# Patient Record
Sex: Male | Born: 1975 | Race: Black or African American | Hispanic: No | Marital: Single | Smoking: Current every day smoker
Health system: Southern US, Community
[De-identification: ages and names within clinical notes are randomized; demographics above are authoritative.]

## PROBLEM LIST (undated history)

## (undated) DIAGNOSIS — M549 Dorsalgia, unspecified: Secondary | ICD-10-CM

## (undated) HISTORY — PX: NO PAST SURGERIES: SHX2092

---

## 1998-11-21 ENCOUNTER — Emergency Department (HOSPITAL_COMMUNITY): Admission: EM | Admit: 1998-11-21 | Discharge: 1998-11-21 | Payer: Self-pay | Admitting: Emergency Medicine

## 1998-11-30 ENCOUNTER — Emergency Department (HOSPITAL_COMMUNITY): Admission: EM | Admit: 1998-11-30 | Discharge: 1998-11-30 | Payer: Self-pay | Admitting: Emergency Medicine

## 1999-07-25 ENCOUNTER — Emergency Department (HOSPITAL_COMMUNITY): Admission: EM | Admit: 1999-07-25 | Discharge: 1999-07-25 | Payer: Self-pay | Admitting: Internal Medicine

## 2000-05-20 ENCOUNTER — Emergency Department (HOSPITAL_COMMUNITY): Admission: EM | Admit: 2000-05-20 | Discharge: 2000-05-20 | Payer: Self-pay | Admitting: Emergency Medicine

## 2000-06-30 ENCOUNTER — Emergency Department (HOSPITAL_COMMUNITY): Admission: EM | Admit: 2000-06-30 | Discharge: 2000-07-01 | Payer: Self-pay | Admitting: Emergency Medicine

## 2001-08-23 ENCOUNTER — Emergency Department (HOSPITAL_COMMUNITY): Admission: EM | Admit: 2001-08-23 | Discharge: 2001-08-23 | Payer: Self-pay | Admitting: Emergency Medicine

## 2001-12-07 ENCOUNTER — Encounter: Payer: Self-pay | Admitting: Emergency Medicine

## 2001-12-07 ENCOUNTER — Emergency Department (HOSPITAL_COMMUNITY): Admission: EM | Admit: 2001-12-07 | Discharge: 2001-12-07 | Payer: Self-pay | Admitting: Emergency Medicine

## 2001-12-09 ENCOUNTER — Emergency Department (HOSPITAL_COMMUNITY): Admission: EM | Admit: 2001-12-09 | Discharge: 2001-12-09 | Payer: Self-pay | Admitting: *Deleted

## 2002-02-05 ENCOUNTER — Emergency Department (HOSPITAL_COMMUNITY): Admission: EM | Admit: 2002-02-05 | Discharge: 2002-02-06 | Payer: Self-pay | Admitting: Emergency Medicine

## 2002-03-21 ENCOUNTER — Emergency Department (HOSPITAL_COMMUNITY): Admission: EM | Admit: 2002-03-21 | Discharge: 2002-03-21 | Payer: Self-pay | Admitting: Emergency Medicine

## 2002-05-15 ENCOUNTER — Emergency Department (HOSPITAL_COMMUNITY): Admission: EM | Admit: 2002-05-15 | Discharge: 2002-05-15 | Payer: Self-pay | Admitting: Emergency Medicine

## 2002-05-15 ENCOUNTER — Encounter: Payer: Self-pay | Admitting: Emergency Medicine

## 2004-02-07 ENCOUNTER — Emergency Department (HOSPITAL_COMMUNITY): Admission: EM | Admit: 2004-02-07 | Discharge: 2004-02-07 | Payer: Self-pay | Admitting: Emergency Medicine

## 2004-03-13 ENCOUNTER — Ambulatory Visit (HOSPITAL_COMMUNITY): Admission: RE | Admit: 2004-03-13 | Discharge: 2004-03-13 | Payer: Self-pay | Admitting: Internal Medicine

## 2004-04-11 ENCOUNTER — Encounter: Admission: RE | Admit: 2004-04-11 | Discharge: 2004-07-10 | Payer: Self-pay | Admitting: Orthopedic Surgery

## 2004-06-29 ENCOUNTER — Ambulatory Visit: Payer: Self-pay | Admitting: Family Medicine

## 2004-07-11 ENCOUNTER — Ambulatory Visit: Payer: Self-pay | Admitting: *Deleted

## 2004-08-17 ENCOUNTER — Emergency Department (HOSPITAL_COMMUNITY): Admission: EM | Admit: 2004-08-17 | Discharge: 2004-08-17 | Payer: Self-pay | Admitting: Emergency Medicine

## 2004-09-28 ENCOUNTER — Ambulatory Visit: Payer: Self-pay | Admitting: Family Medicine

## 2004-11-13 ENCOUNTER — Emergency Department (HOSPITAL_COMMUNITY): Admission: EM | Admit: 2004-11-13 | Discharge: 2004-11-13 | Payer: Self-pay | Admitting: *Deleted

## 2006-01-10 ENCOUNTER — Inpatient Hospital Stay (HOSPITAL_COMMUNITY): Admission: EM | Admit: 2006-01-10 | Discharge: 2006-01-14 | Payer: Self-pay | Admitting: Emergency Medicine

## 2006-01-10 ENCOUNTER — Ambulatory Visit: Payer: Self-pay | Admitting: Internal Medicine

## 2006-01-11 ENCOUNTER — Ambulatory Visit: Payer: Self-pay | Admitting: Cardiology

## 2006-01-11 ENCOUNTER — Encounter: Payer: Self-pay | Admitting: Cardiology

## 2006-02-06 ENCOUNTER — Ambulatory Visit: Payer: Self-pay | Admitting: Family Medicine

## 2007-03-07 IMAGING — CR DG CHEST 2V
2 series · 2 of 2 positions shown · non-contrast
Comparison: Earlier 01/11/06 study.

CLINICAL DATA: Fever, cough.  Follow-up aeration.
 CHEST - 2 VIEW:

[w chest pa]
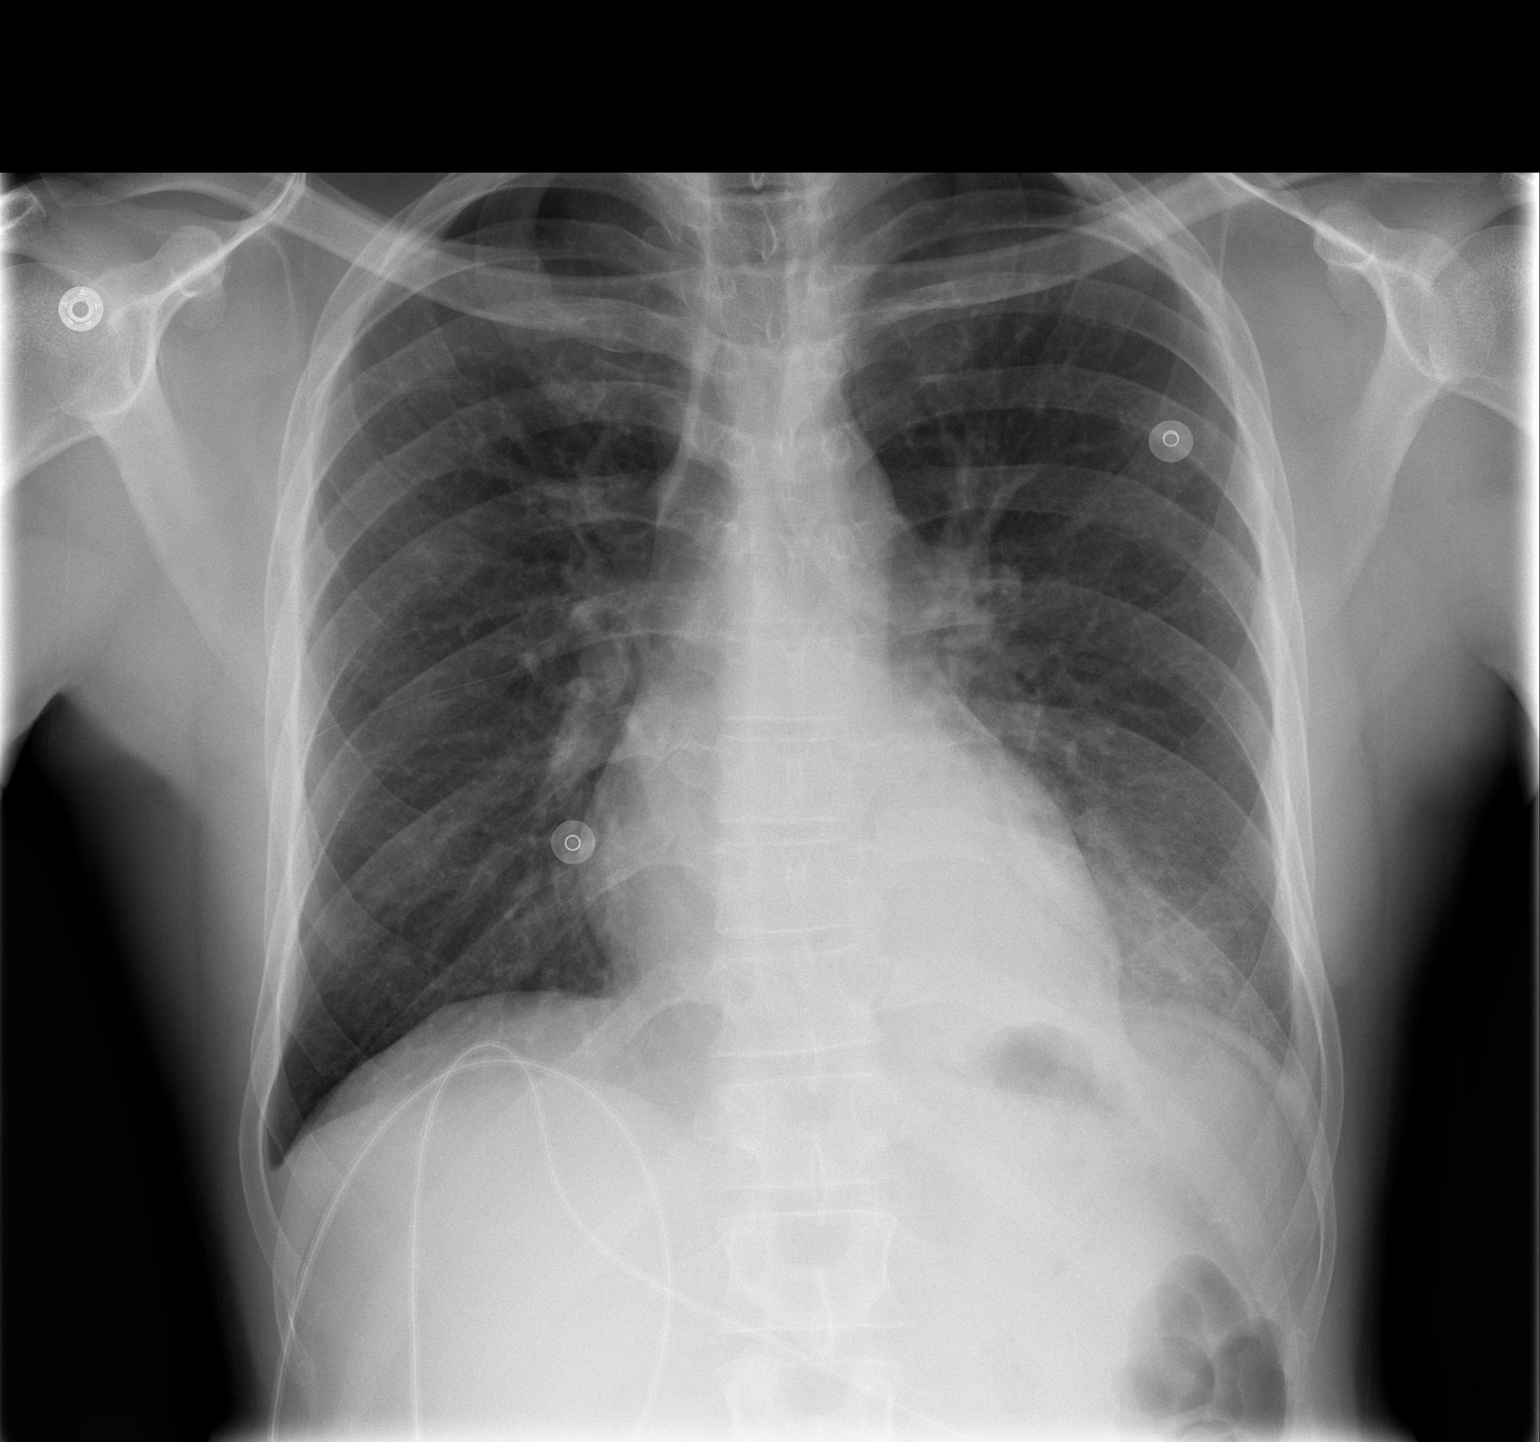

[w chest lat]
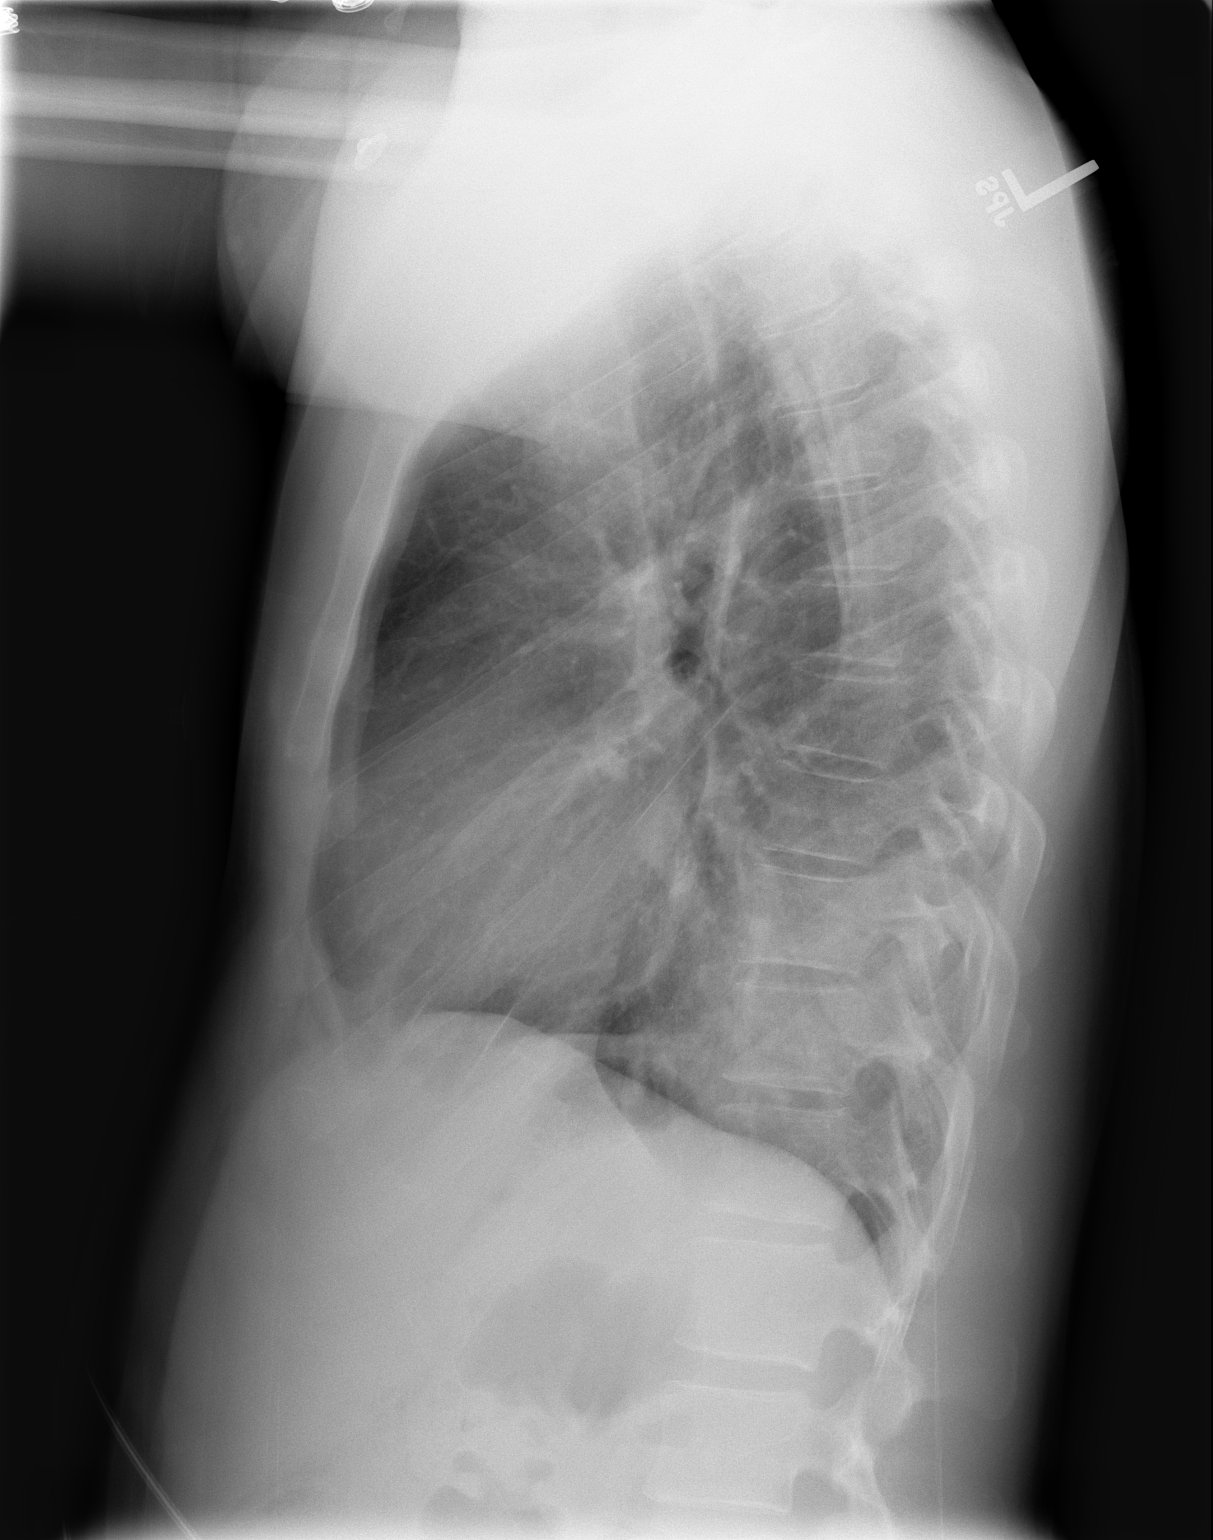

[2 of 2 positions shown; findings below may reference images not displayed]

FINDINGS: There has been interval improvement in the previously seen interstitial accentuation.  Persistent left lower lobe consolidative infiltrate noted.
IMPRESSION: Interval improvement in the previously seen mild interstitial accentuation.  Persistent left lower lobe consolidative infiltrate noted.

## 2007-06-18 ENCOUNTER — Emergency Department (HOSPITAL_COMMUNITY): Admission: EM | Admit: 2007-06-18 | Discharge: 2007-06-18 | Payer: Self-pay | Admitting: Emergency Medicine

## 2007-10-23 ENCOUNTER — Emergency Department (HOSPITAL_COMMUNITY): Admission: EM | Admit: 2007-10-23 | Discharge: 2007-10-23 | Payer: Self-pay | Admitting: Emergency Medicine

## 2008-10-08 ENCOUNTER — Emergency Department (HOSPITAL_COMMUNITY): Admission: EM | Admit: 2008-10-08 | Discharge: 2008-10-08 | Payer: Self-pay | Admitting: Emergency Medicine

## 2009-02-17 ENCOUNTER — Emergency Department (HOSPITAL_COMMUNITY): Admission: EM | Admit: 2009-02-17 | Discharge: 2009-02-17 | Payer: Self-pay | Admitting: Emergency Medicine

## 2010-11-28 ENCOUNTER — Inpatient Hospital Stay (INDEPENDENT_AMBULATORY_CARE_PROVIDER_SITE_OTHER)
Admission: RE | Admit: 2010-11-28 | Discharge: 2010-11-28 | Disposition: A | Discharge status: Home / Self Care | Payer: Self-pay | Source: Ambulatory Visit | Attending: Emergency Medicine | Admitting: Emergency Medicine

## 2010-11-28 DIAGNOSIS — M62838 Other muscle spasm: Secondary | ICD-10-CM

## 2010-11-28 DIAGNOSIS — S335XXA Sprain of ligaments of lumbar spine, initial encounter: Secondary | ICD-10-CM

## 2011-03-02 NOTE — Discharge Summary (Signed)
Erik Sims, Erik Sims NO.:  [id number]   MEDICAL RECORD NO.:  [id number]          PATIENT TYPE:  INP   LOCATION:  6713                         FACILITY:  [facility]   PHYSICIAN:  Ramiro Harvest, MD    DATE OF BIRTH:  1976/02/08   DATE OF ADMISSION:  01/10/2006  DATE OF DISCHARGE:  01/14/2006                                 DISCHARGE SUMMARY   DISCHARGE DIAGNOSES:  1.  Pneumonia.  2.  Elevated cardiac enzymes.  3.  Polysubstance abuse.  4.  Tobacco use.   DISCHARGE MEDICATIONS:  Avelox 400 mg p.o. daily x6 days.   DISPOSITION:  On the day of discharge Erik Sims was in good condition. He  was afebrile, his vital signs were stable, and he had a normal white blood  cell count. Chest x-ray on the morning of discharge revealed a resolving  left lower lobe pneumonia. The patient had received 4 of 10 days of  antibiotics at this point. Clinically he looked well. He was eager to leave  the hospital, and he had instructions to return to the [facility]  today. On the day of discharge he was given a prescription for Avelox which  he was going to fill at [facility] and at that time he would stop  by the appointment window and set up a follow up appointment within 1 week  of his discharge. The patient was in full understanding of this plan, and A.  IRandon Goldsmith spoke to [facility] reception who is also understanding of  this plan and would see to it that it was facilitated this way. There are no  crucial lab values to obtain in this follow up visit. However attention  should be paid to the patient's clinical examination to ensure resolving  pneumonia.   PROCEDURE:  On January 11, 2006 the patient had a transthoracic echocardiogram  which he tolerated well. The echocardiogram revealed left ventricular  ejection fraction estimated to be 50%. It also revealed hypokinesis of the  mid and apical segments of the inferior wall and inferior septum.   CONSULTATIONS:  There were no consultations obtained for the patient.   BRIEF HISTORY AND PHYSICAL:  Erik Sims is a 35 year old African-American  male with a history of lower back pain who presents with 1 day of coughing  and upper left-sided back pain. The patient had an upper respiratory-like  illness around 3 days prior to admission. This evolved into a cough that is  now productive of clear yellow and occasionally blood tinged sputum. The  patient had reported subjective fever with shaking chills. The patient is a  smoker with a greater than 10 pack year history. The patient also reports  positive history of smoking crack, but per his report he quit in late 2006.  The patient also consumes on average one 6-pack of beer per day. He denies  any recent trauma, black outs or loss of consciousness. Please see the  patient's chart for any further details regarding the admission history.   ADMISSION PHYSICAL EXAM:  The patient's temperature was 102.7 degrees  Fahrenheit, blood  pressure was 122/79, his pulse was 124, respiratory rate  of 18, O2 saturations were 87% on room air. In general he is an ill-  appearing man lying on his right side in apparent discomfort. His eyes were  anicteric, pupils are equal, round and reactive to light and accommodation.  Extraocular muscles were intact. Tympanic membranes were pearly bilaterally.  His posterior oropharynx was pink and moist. He had poor dentition noted.  His neck was supple. On examination of his lungs, coarse rhonchi were heard  in the left lower lobe, he also had coarse breath sounds in the right lower  lobe. Cardiovascular exam revealed tachycardia with no murmurs, rubs, or  gallops. Abdomen was slightly distended but was soft, nontender to palpation  with no rebound and no guarding. Extremities revealed no clubbing, cyanosis  or edema. His skin was intact with no rashes or breakdown. He had no  cervical or submandibular  lymphadenopathy. He was alert and oriented x3  however the patient made very poor eye contact. He was somewhat inattentive.  He denies any depression, anxiety or suicidal or homicidal ideation.   ADMISSION LABS:  Revealed a sodium of 134, potassium of 4.0, chloride of 98,  bicarb of 24, BUN of 7, creatinine of 1.1, glucose of 114. A white blood  cell count of 19.6 thousand, platelets of 276,000, hemoglobin 13.1.  Bilirubin 0.8, alkaline phosphatase 107, AST 23, ALT 19, protein 6.9,  albumin 3.9, calcium 9.6. He also had a urinalysis performed that was only  positive for ketones. He had a negative alcohol screen. He had a positive  strep pneumococcus urinary antigen. He had a urine drug screen which was  positive for cocaine as well as THC.   HOSPITAL COURSE:  PROBLEM #1. Left lower lobe pneumonia. The patient was  admitted to [facility] and started on empiric antibiotics. He  received IV Avelox. We continued to follow the patient clinically. His white  blood cell count again on admission was 19.6 thousand, it rose on the second  day of admission to  24.4 thousand and began to slowly trend down to 21.2 thousand and then 11.6  thousand. The patient appeared quite ill on the day of admission. Throughout  his hospitalization though clinically he improved quite well.  On the day of  discharge he was fully ambulating and very eager to leave the hospital. He  reported feeling quite well and again, was eager to leave. We also followed  the patient's pneumonia which surprisingly resolved quite well  radiographically. Chest x-ray on the day of discharge showed a resolving  left lower lobe pneumonia.   PROBLEM #2. Elevated cardiac enzymes. The admitting team was concerned that  due to the patient's positive drug screen and history of smoking crack, that  his heart may have suffered some type of cocaine induced strain. Cardiac enzymes as well as an EKG were obtained on the day of  admission. EKG was  read as normal. However the patient's first set of cardiac enzymes revealed  an elevated CK-MB of 5.2 and an elevated troponin I of 0.16. A second set  obtained 8 hours later revealed a normal CK-MB of 3.5 and a troponin I of  0.18. Another set obtained 8 hours later revealed a normal CK-MB of 0.9 and  a normal troponin I of 0.03. The treating team did not think that this was  an acute ischemic event. However to ensure that he had sufficient cardiac  function and no worrisome findings,  a 2D echocardiogram was performed. Again  this was performed on January 11, 2006. Please see the above transcription.  Basically it revealed some hypokinesis of the inferior wall as well as  septum, otherwise with a normal ejection fraction. The patient again never  complained of any chest pain, shortness of breath or diaphoresis. The  treating team was quite comfortable with his cardiac status at the time of  discharge.   PROBLEM #3. Polysubstance abuse. Again the patient had a positive history of  smoking crack cocaine as well as a positive urine drug screen for cocaine as  well as THC. The patient was offered clinical work Chief of Staff and a  brief psychosocial assessment. He was offered substance abuse treatment  referral and the patient denied the need for any help. He has never received  any substance abuse treatment in the past. He did express appreciation to  the clinical social worker however for attempt at referrals.   PROBLEM #4. Tobacco abuse. Again, tobacco cessation tools were offered to  Mr. Fuerte. However he denied wanting or needed such tools.   DISCHARGE LABS:  On the day of discharge patient had a sodium of 138,  potassium of 4.3, chloride of 105, CO2 of 27, glucose of 113, BUN of 5,  creatinine of 1.1, calcium of 9.2. His white blood cell count was 12.6  thousand, hemoglobin was 13.0, hematocrit 40.1, MCV of 74.7, platelets were  379,000.   His vital signs  on the day of discharge revealed a temperature of 97.6,  pulse of 91, respirations of 18, systolic blood pressure of 92, diastolic  62. He was saturating 97% on room air.      Ramiro Harvest, MD    ______________________________  Ramiro Harvest, MD    DT/MEDQ  D:  01/14/2006  T:  01/16/2006  Job:  [id number]   cc:   Ileana Roup, M.D.  Fax: [phone]   [facility]  [address]

## 2014-07-11 ENCOUNTER — Emergency Department (HOSPITAL_COMMUNITY)
Admission: EM | Admit: 2014-07-11 | Discharge: 2014-07-11 | Disposition: A | Discharge status: Home / Self Care | Payer: Self-pay | Attending: Emergency Medicine | Admitting: Emergency Medicine

## 2014-07-11 DIAGNOSIS — S39012A Strain of muscle, fascia and tendon of lower back, initial encounter: Secondary | ICD-10-CM

## 2014-07-11 DIAGNOSIS — Y990 Civilian activity done for income or pay: Secondary | ICD-10-CM | POA: Insufficient documentation

## 2014-07-11 DIAGNOSIS — M545 Low back pain, unspecified: Secondary | ICD-10-CM | POA: Insufficient documentation

## 2014-07-11 DIAGNOSIS — Y9389 Activity, other specified: Secondary | ICD-10-CM | POA: Insufficient documentation

## 2014-07-11 DIAGNOSIS — S335XXA Sprain of ligaments of lumbar spine, initial encounter: Secondary | ICD-10-CM | POA: Insufficient documentation

## 2014-07-11 DIAGNOSIS — Y9289 Other specified places as the place of occurrence of the external cause: Secondary | ICD-10-CM | POA: Insufficient documentation

## 2014-07-11 DIAGNOSIS — X503XXA Overexertion from repetitive movements, initial encounter: Secondary | ICD-10-CM | POA: Insufficient documentation

## 2014-07-11 MED ORDER — DIAZEPAM 5 MG PO TABS: 5.0000 mg | ORAL_TABLET | Freq: Four times a day (QID) | ORAL | Status: DC | PRN

## 2014-07-11 NOTE — ED Notes (Signed)
Neuro assessment at 12:35. Charted on wrong pt. By Gayleen Orem, RN

## 2014-07-11 NOTE — ED Provider Notes (Signed)
CSN: [id number]     Arrival date & time 07/11/14  1029 History   First MD Initiated Contact with Patient 07/11/14 1056     Chief Complaint  Patient presents with  . Back Pain    Herniated disc at L5-S1 in 2005 it has flared up again about 1 week ago     (Consider location/radiation/quality/duration/timing/severity/associated sxs/prior Treatment) Patient is a 38 y.o. male presenting with back pain.  Back Pain Location:  Lumbar spine Quality:  Aching Radiates to:  Does not radiate Pain severity:  Severe Pain is:  Same all the time Onset quality:  Gradual Duration:  1 week Timing:  Constant Progression:  Unchanged Chronicity:  Recurrent Context comment:  Works Curator Relieved by:  Nothing Worsened by:  Touching and twisting Associated symptoms: no bladder incontinence, no bowel incontinence, no dysuria, no fever, no leg pain, no numbness, no paresthesias, no perianal numbness and no tingling     No past medical history on file. No past surgical history on file. No family history on file. History  Substance Use Topics  . Smoking status: Not on file  . Smokeless tobacco: Not on file  . Alcohol Use: Not on file    Review of Systems  Constitutional: Negative for fever.  Gastrointestinal: Negative for bowel incontinence.  Genitourinary: Negative for bladder incontinence and dysuria.  Musculoskeletal: Positive for back pain.  Neurological: Negative for tingling, numbness and paresthesias.  All other systems reviewed and are negative.     Allergies  Prednisone  Home Medications   Prior to Admission medications   Medication Sig Start Date End Date Taking? Authorizing Provider  diazepam (VALIUM) 5 MG tablet Take 1 tablet (5 mg total) by mouth every 6 (six) hours as needed for muscle spasms. 07/11/14   Mirian Mo, MD   BP 123/82  Pulse 78  Temp(Src) 98 F (36.7 C) (Oral)  Resp 16  SpO2 100% Physical Exam  Vitals reviewed. Constitutional: He is  oriented to person, place, and time. He appears well-developed and well-nourished.  HENT:  Head: Normocephalic and atraumatic.  Eyes: Conjunctivae and EOM are normal.  Neck: Normal range of motion. Neck supple.  Cardiovascular: Normal rate, regular rhythm and normal heart sounds.   Pulmonary/Chest: Effort normal and breath sounds normal. No respiratory distress.  Abdominal: He exhibits no distension. There is no tenderness. There is no rebound and no guarding.  Musculoskeletal: Normal range of motion.       Cervical back: Normal.       Thoracic back: Normal.       Lumbar back: He exhibits pain. He exhibits no tenderness and no bony tenderness.  Neurological: He is alert and oriented to person, place, and time.  Neuro exam Bil LE unremarkable  Skin: Skin is warm and dry.    ED Course  Procedures (including critical care time) Labs Review Labs Reviewed - No data to display  Imaging Review No results found.   EKG Interpretation None      MDM   Final diagnoses:  Lumbar strain, initial encounter    38 y.o. male with pertinent PMH of DDD presents with recurrent lumbar pain x 1 week.  Patient was in his normal state of health and works in Phot[employer], when he began to have spasms in his back. He has had caudal work 2 days this week. He denies fever, nausea, vomiting, numbness, tingling, any historical features of cauda equina. On arrival today vitals signs and physical exam as above unremarkable  with the exception of pain in his lumbar spine. As pain is worsened by movement and is consistent with prior degenerative disc flare and has no signs of cord compression given stable to discharge with standard therapy for lumbar strain. He was given referral to neurosurgery for a spine. He voiced understanding of precautions, agreed with plan and will followup.    1. Lumbar strain, initial encounter         Mirian Mo, MD 07/11/14 1152

## 2014-07-11 NOTE — ED Notes (Signed)
Pt reports that pain in back around L5-S1,that he was originally dx in 2005, no surgery at that time just dx of herniated disc.    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

## 2014-07-11 NOTE — Discharge Instructions (Signed)
Back Exercises °Back exercises help treat and prevent back injuries. The goal of back exercises is to increase the strength of your abdominal and back muscles and the flexibility of your back. These exercises should be started when you no longer have back pain. Back exercises include: °· Pelvic Tilt. Lie on your back with your knees bent. Tilt your pelvis until the lower part of your back is against the floor. Hold this position 5 to 10 sec and repeat 5 to 10 times. °· Knee to Chest. Pull first 1 knee up against your chest and hold for 20 to 30 seconds, repeat this with the other knee, and then both knees. This may be done with the other leg straight or bent, whichever feels better. °· Sit-Ups or Curl-Ups. Bend your knees 90 degrees. Start with tilting your pelvis, and do a partial, slow sit-up, lifting your trunk only 30 to 45 degrees off the floor. Take at least 2 to 3 seconds for each sit-up. Do not do sit-ups with your knees out straight. If partial sit-ups are difficult, simply do the above but with only tightening your abdominal muscles and holding it as directed. °· Hip-Lift. Lie on your back with your knees flexed 90 degrees. Push down with your feet and shoulders as you raise your hips a couple inches off the floor; hold for 10 seconds, repeat 5 to 10 times. °· Back arches. Lie on your stomach, propping yourself up on bent elbows. Slowly press on your hands, causing an arch in your low back. Repeat 3 to 5 times. Any initial stiffness and discomfort should lessen with repetition over time. °· Shoulder-Lifts. Lie face down with arms beside your body. Keep hips and torso pressed to floor as you slowly lift your head and shoulders off the floor. °Do not overdo your exercises, especially in the beginning. Exercises may cause you some mild back discomfort which lasts for a few minutes; however, if the pain is more severe, or lasts for more than 15 minutes, do not continue exercises until you see your caregiver.  Improvement with exercise therapy for back problems is slow.  °See your caregivers for assistance with developing a proper back exercise program. °Document Released: 11/08/2004 Document Revised: 12/24/2011 Document Reviewed: 08/02/2011 °ExitCare® Patient Information ©2015 [employer]. This information is not intended to replace advice given to you by your health care provider. Make sure you discuss any questions you have with your health care provider. °Back Pain, Adult °Low back pain is very common. About 1 in 5 people have back pain. The cause of low back pain is rarely dangerous. The pain often gets better over time. About half of people with a sudden onset of back pain feel better in just 2 weeks. About 8 in 10 people feel better by 6 weeks.  °CAUSES °Some common causes of back pain include: °· Strain of the muscles or ligaments supporting the spine. °· Wear and tear (degeneration) of the spinal discs. °· Arthritis. °· Direct injury to the back. °DIAGNOSIS °Most of the time, the direct cause of low back pain is not known. However, back pain can be treated effectively even when the exact cause of the pain is unknown. Answering your caregiver's questions about your overall health and symptoms is one of the most accurate ways to make sure the cause of your pain is not dangerous. If your caregiver needs more information, he or she may order lab work or imaging tests (X-rays or MRIs). However, even if imaging tests show changes in your back,   this usually does not require surgery. °HOME CARE INSTRUCTIONS °For many people, back pain returns. Since low back pain is rarely dangerous, it is often a condition that people can learn to manage on their own.  °· Remain active. It is stressful on the back to sit or stand in one place. Do not sit, drive, or stand in one place for more than 30 minutes at a time. Take short walks on level surfaces as soon as pain allows. Try to increase the length of time you walk each  day. °· Do not stay in bed. Resting more than 1 or 2 days can delay your recovery. °· Do not avoid exercise or work. Your body is made to move. It is not dangerous to be active, even though your back may hurt. Your back will likely heal faster if you return to being active before your pain is gone. °· Pay attention to your body when you  bend and lift. Many people have less discomfort when lifting if they bend their knees, keep the load close to their bodies, and avoid twisting. Often, the most comfortable positions are those that put less stress on your recovering back. °· Find a comfortable position to sleep. Use a firm mattress and lie on your side with your knees slightly bent. If you lie on your back, put a pillow under your knees. °· Only take over-the-counter or prescription medicines as directed by your caregiver. Over-the-counter medicines to reduce pain and inflammation are often the most helpful. Your caregiver may prescribe muscle relaxant drugs. These medicines help dull your pain so you can more quickly return to your normal activities and healthy exercise. °· Put ice on the injured area. °¨ Put ice in a plastic bag. °¨ Place a towel between your skin and the bag. °¨ Leave the ice on for 15-20 minutes, 03-04 times a day for the first 2 to 3 days. After that, ice and heat may be alternated to reduce pain and spasms. °· Ask your caregiver about trying back exercises and gentle massage. This may be of some benefit. °· Avoid feeling anxious or stressed. Stress increases muscle tension and can worsen back pain. It is important to recognize when you are anxious or stressed and learn ways to manage it. Exercise is a great option. °SEEK MEDICAL CARE IF: °· You have pain that is not relieved with rest or medicine. °· You have pain that does not improve in 1 week. °· You have new symptoms. °· You are generally not feeling well. °SEEK IMMEDIATE MEDICAL CARE IF:  °· You have pain that radiates from your back into  your legs. °· You develop new bowel or bladder control problems. °· You have unusual weakness or numbness in your arms or legs. °· You develop nausea or vomiting. °· You develop abdominal pain. °· You feel faint. °Document Released: 10/01/2005 Document Revised: 04/01/2012 Document Reviewed: 02/02/2014 °ExitCare® Patient Information ©2015 [employer]. This information is not intended to replace advice given to you by your health care provider. Make sure you discuss any questions you have with your health care provider. ° ° °Emergency Department Resource Guide °1) Find a Doctor and Pay Out of Pocket °Although you won't have to find out who is covered by your insurance plan, it is a good idea to ask around and get recommendations. You will then need to call the office and see if the doctor you have chosen will accept you as a new patient and what types of options they offer for patients who are self-pay. Some doctors offer discounts or will set   up payment plans for their patients who do not have insurance, but you will need to ask so you aren't surprised when you get to your appointment. ° °2) Contact Your Local Health Department °Not all health departments have doctors that can see patients for sick visits, but many do, so it is worth a call to see if yours does. If you don't know where your local health department is, you can check in your phone book. The CDC also has a tool to help you locate your state's health department, and many state websites also have listings of all of their local health departments. ° °3) Find a Walk-in Clinic °If your illness is not likely to be very severe or complicated, you may want to try a walk in clinic. These are popping up all over the country in pharmacies, drugstores, and shopping centers. They're usually staffed by nurse practitioners or physician assistants that have been trained to treat common illnesses and complaints. They're usually fairly quick and inexpensive. However,  if you have serious medical issues or chronic medical problems, these are probably not your best option. ° °No Primary Care Doctor: °- Call [facility] at  832-8000 - they can help you locate a primary care doctor that  accepts your insurance, provides certain services, etc. °- Physician Referral Service- 1-800-533-3463 ° °Chronic Pain Problems: °Organization         Address  Phone   Notes  °[facility]  (336) 297-2271 Patients need to be referred by their primary care doctor.  ° °Medication Assistance: °Organization         Address  Phone   Notes  °[place] [facility] [address] °[place], [place] [address] (336) 641-8030 --Must be a resident of [place] °-- Must have NO insurance coverage whatsoever (no Medicaid/ Medicare, etc.) °-- The pt. MUST have a primary care doctor that directs their care regularly and follows them in the community °  °[facility]  (866) 331-1348   °[employer]  (888) 892-1162   ° °Agencies that provide inexpensive medical care: °Organization         Address  Phone   Notes  °[facility]  (336) 832-8035   °[facility]    (336) 832-7272   °[facility] [address] °[place], [place] [address] (336) 832-4777   °[facility] [address], °[place] (336) 271-4999   °[facility]    (336) 373-0678   °[facility]    (336) 272-1050   °[facility] ° [address], [place] Phone:  (336) 832-4444, Fax:  (336) 832-4440 Hours of Operation:  9 am - 6 pm, M-F.  Also accepts Medicaid/Medicare and self-pay.  °[facility] ° [address], [place] Phone: (336) 832-3150, Fax: (336) 832-3151. Hours of Operation:  8:30 am - 5:30 pm, M-F.  Also accepts Medicaid and self-pay.  °[facility] [place] [address], [place] Phone: (336) 878-6027   °[facility] [address], [place], [place] (336)723-1848, Ext. 123 Mondays & Thursdays: 7-9 AM.  First 15 patients are seen on a first come, first serve basis. °  ° °Medicaid-accepting [place] Providers: ° °Organization         Address  Phone   Notes  °[facility] [address],  (336) 641-2100 Also   accepts self-pay patients.  °[facility] [address], [place] ° (336) 856-9996   °[facility] [address], [place] (336) 288-8857   °[facility] [address] [place] [address], [place] (336) 299-7000   °Veita Bland [address], [place]  ° (336) 373-1557 Only accepts [place] Access Medicaid patients after they have their name applied to their card.  ° °Self-Pay (no insurance) in [place]: ° °Organization         Address  Phone   Notes  °Sickle Cell Patients, [facility] [address], [place] (336) 832-1970   °[facility] [address], [place] (336) 832-4400   °[facility] ° [address], [place] (336) 992-4800   °[facility]/Dr. Osei-Bonsu ° [address] [place] [address], [place] or [address], [place] (336) 841-8500 Phone number for both [place] and [place] locations is the same.  °[facility] [address], [place] (336) 299-0000   °[facility] [address] [place] [address], [place] or [address] (336) 852-7530 °(336) 878-2260   °[facility] [address], [place] (336) 350-1642, phone; (336) 294-5005, fax Sees patients 1st and 3rd Saturday of every month.  Must not qualify for public or private insurance (i.e. Medicaid, Medicare, [place] Health Choice, Veterans' Benefits) • Household income should be no more than 200% of the poverty level •The clinic cannot treat you if you are pregnant or think you are  pregnant • Sexually transmitted diseases are not treated at the clinic.  ° ° °Dental Care: °Organization         Address  Phone  Notes  °[facility] [address], [place] (336) 641-6152 Accepts children up to age 21 who are enrolled in Medicaid or [place] Health Choice; pregnant women with a Medicaid card; and children who have applied for Medicaid or [place] Health Choice, but were declined, whose parents can pay a reduced fee at time of service.  °[facility] [place]  [address], [place] (336) 641-7733 Accepts children up to age 21 who are enrolled in Medicaid or [place] Health Choice; pregnant women with a Medicaid card; and children who have applied for Medicaid or [place] Health Choice, but were declined, whose parents can pay a reduced fee at time of service.  °[facility] ° [address],  (336) 641-4533 Patients are seen by appointment only. Walk-ins are not accepted. [facility] will see patients 18 years of age and older. °Monday - Tuesday (8am-5pm) °Most Wednesdays (8:30-5pm) °$30 per visit, cash only  °[facility] ° [address], [place] (336) 641-4533 Patients are seen by appointment only. Walk-ins are not accepted. [facility] will see patients 18 years of age and older. °One Wednesday Evening (Monthly: Volunteer Based).  $30 per visit, cash only  °[facility]  (919) 537-3737 for adults; Children under age 4, call Graduate Pediatric Dentistry at (919) [phone]. Children aged 4-14, please call (919) [phone] to request a pediatric application. ° Dental services are provided in all areas of dental care including fillings, crowns and bridges, complete and partial dentures, implants, gum treatment, root canals, and extractions. Preventive care is also provided. Treatment is provided   to both adults and  children. °Patients are selected via a lottery and there is often a waiting list. °  °[facility] [address], °[place] ° (336) 763-8833 [email or web address] °  °[facility] [address], [place], [place] (336)723-1848, Ext. 123 Second and Fourth Thursday of each month, opens at 6:30 AM; Clinic ends at 9 AM.  Patients are seen on a first-come first-served basis, and a limited number are seen during each clinic.  ° °[facility] ° [address], [place], [place] (336) 723-7904   Eligibility Requirements °You must have lived in [place], [place], or [place] counties for at least the last three months. °  You cannot be eligible for state or federal sponsored healthcare insurance, including Veterans Administration, Medicaid, or Medicare. °  You generally cannot be eligible for healthcare insurance through your employer.  °  How to apply: °Eligibility screenings are held every Tuesday and Wednesday afternoon from 1:00 pm until 4:00 pm. You do not need an appointment for the interview!  °[facility] [address], [place], [place] 336-631-2330   °[facility]  336-342-8273   °[facility]  336-703-3100   °[facility]  336-570-6415   ° °Behavioral Health Resources in the Community: °Intensive Outpatient Programs °Organization         Address  Phone  Notes  °[facility] [address], [place], [place] 336-878-6098   °[facility] [address], [place], [place] 336-832-9800   °[facility] [address], [place], [place] ° 336-882-2125   °[facility] [address],  °[place], [place] 1-800-853-5163 or 336-641-4981   °Substance Abuse Resources °Organization         Address  Phone  Notes  °[facility]  336-882-2125   °[facility]  336-784-9470   °[facility]  336-285-9073    °[facility]  336-845-3988   °Residential & Outpatient Substance Abuse Program  1-800-659-3381   °Psychological Services °Organization         Address  Phone  Notes  °[facility]  336- 832-9600   °[facility]  336- 378-7881   °[facility] [address], [place] 1-800-853-5163 or 336-641-4981   ° °Mobile Crisis Teams °Organization         Address  Phone  Notes  °[facility], Mobile Crisis Care Unit  1-877-626-1772   °Assertive °[facility] ° [address] [place], [place] 336-834-9664   °Sharon DeEsch [address] °[place] 336-554-5454   ° °Self-Help/Support Groups °Organization         Address  Phone             Notes  °[facility] [place] - variety of support groups  336- 373-1402 Call for more information  °Narcotics Anonymous (NA), [facility] [address], °[place]  2 meetings at this location  ° °Residential Treatment Programs °Organization         Address  Phone  Notes  °[facility] [address],    °[place]  1-866-801-8205   °[facility] ° [address], [place], [place] 704-293-8524   °[facility] [address], [place] 336-845-3988 Admissions: 8am-3pm M-F  °[facility] [address]    °[place],  336-841-1104   °  [facility] [address], [place], [place] 336-379-7146   °[facility] [address]  °[place], [place] 336-285-9073   °[facility] Intensive Outpatient [address], [place], [place] 336-852-3033   °[facility] [address]  °[place], [place] 1-877-615-2722 or 336-784-9470   °[facility] [address] [place], [place] 336-227-7417 Accepts Medicaid  °[facility] [address]  °[place] 1-800-659-3381 Substance Abuse/Addiction Treatment  ° °[place]  Behavioral Health Resources °Organization         Address  Phone  Notes  °[facility]  (888) 581-9988   °Julie Brannon, PhD [address] [place], [place]   (336) 349-5553 or (336) 951-0000   °[facility]   [address] °[place], [place] (336) 349-4454   °[facility] [address], [place], [place] (336) 342-8316 Insurance/Medicaid/sponsorship through [facility]  °[facility] [address]                                    [place], [place] (336) 342-8316 Therapy/tele-psych/case  °[facility] [address]  ° [place], [place] (336) 349-2233    °Dr. Arfeen  (336) 349-4544   °[facility] [place]  [employer] [place] [facility] 1) [address], [place] °2) [address], [place] °3)  [address], [place] (336) 349-3220 °(336) 342-7768 ° °(336) 342-8140   °[place] [facility] (336) 342-1394 or (336) 342-3537 (After Hours)    ° ° ° °

## 2014-07-11 NOTE — ED Notes (Signed)
Bed: WA01 Expected date:  Expected time:  Means of arrival:  Comments: 

## 2014-11-10 ENCOUNTER — Emergency Department (HOSPITAL_COMMUNITY)
Admission: EM | Admit: 2014-11-10 | Discharge: 2014-11-10 | Disposition: A | Discharge status: Home / Self Care | Payer: Self-pay | Attending: Emergency Medicine | Admitting: Emergency Medicine

## 2014-11-10 ENCOUNTER — Encounter (HOSPITAL_COMMUNITY): Payer: Self-pay | Admitting: *Deleted

## 2014-11-10 DIAGNOSIS — Z79899 Other long term (current) drug therapy: Secondary | ICD-10-CM | POA: Insufficient documentation

## 2014-11-10 DIAGNOSIS — M545 Low back pain, unspecified: Secondary | ICD-10-CM

## 2014-11-10 DIAGNOSIS — Z720 Tobacco use: Secondary | ICD-10-CM | POA: Insufficient documentation

## 2014-11-10 DIAGNOSIS — Z202 Contact with and (suspected) exposure to infections with a predominantly sexual mode of transmission: Secondary | ICD-10-CM

## 2014-11-10 HISTORY — DX: Dorsalgia, unspecified: M54.9

## 2014-11-10 MED ORDER — AZITHROMYCIN 250 MG PO TABS: 1000.0000 mg | ORAL_TABLET | Freq: Once | ORAL | Status: DC

## 2014-11-10 MED ORDER — AZITHROMYCIN 250 MG PO TABS
1000.0000 mg | ORAL_TABLET | Freq: Once | ORAL | Status: AC
  Administered 2014-11-10: 1000 mg via ORAL
  Filled 2014-11-10: qty 4

## 2014-11-10 MED ORDER — DIAZEPAM 5 MG PO TABS: 5.0000 mg | ORAL_TABLET | Freq: Four times a day (QID) | ORAL | Status: DC | PRN

## 2014-11-10 MED ORDER — KETOROLAC TROMETHAMINE 30 MG/ML IJ SOLN
30.0000 mg | Freq: Once | INTRAMUSCULAR | Status: AC
  Administered 2014-11-10: 30 mg via INTRAMUSCULAR
  Filled 2014-11-10: qty 1

## 2014-11-10 NOTE — ED Notes (Signed)
Pt reports chronic low back pain x 1 week.  Has herniated disc in 2006.  Denies injury at this time.

## 2014-11-10 NOTE — ED Notes (Signed)
Patient is alert and oriented x3.  He was given DC instructions and follow up visit instructions.  Patient gave verbal understanding.  He was DC ambulatory under his own power to home.  V/S stable.  He was not showing any signs of distress on DC 

## 2014-11-10 NOTE — ED Provider Notes (Signed)
CSN: [id number]     Arrival date & time 11/10/14  0054 History   First MD Initiated Contact with Patient 11/10/14 0134     Chief Complaint  Patient presents with  . Back Pain     (Consider location/radiation/quality/duration/timing/severity/associated sxs/prior Treatment) HPI Comments: This a 39 year old African-American male with a history of herniated disc at L5 in 2006 that was not corrected with surgery.  He's had intermittent episodes of pain since reports that for the past week he's had continuous low back pain that does not radiate.  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  Has been taking over-the-counter ibuprofen with little relief.  Denies any bowel or bladder incontinence, loss of sensation, inability to move his leg forward.  Denies numbness and tingling  Patient is a 39 y.o. male presenting with back pain. The history is provided by the patient.  Back Pain Location:  Lumbar spine Quality:  Aching Radiates to:  Does not radiate Pain severity:  Moderate Pain is:  Same all the time Onset quality:  Unable to specify Timing:  Constant Progression:  Unchanged Chronicity:  New Context: lifting heavy objects, physical stress and twisting   Relieved by:  Nothing Worsened by:  Movement Ineffective treatments:  NSAIDs Associated symptoms: no abdominal pain, no abdominal swelling, no bladder incontinence, no bowel incontinence, no chest pain, no dysuria, no fever, no leg pain, no numbness, no paresthesias and no weakness     Past Medical History  Diagnosis Date  . Back pain    History reviewed. No pertinent past surgical history. No family history on file. History  Substance Use Topics  . Smoking status: Current Every Day Smoker -- 1.00 packs/day    Types: Cigarettes  . Smokeless tobacco: Not on file  . Alcohol Use: Yes    Review of Systems  Constitutional:  Negative for fever.  Cardiovascular: Negative for chest pain.  Gastrointestinal: Negative for abdominal pain and bowel incontinence.  Genitourinary: Negative for bladder incontinence, dysuria, frequency and flank pain.  Musculoskeletal: Positive for back pain. Negative for joint swelling.  Neurological: Negative for weakness, numbness and paresthesias.  All other systems reviewed and are negative.     Allergies  Prednisone  Home Medications   Prior to Admission medications   Medication Sig Start Date End Date Taking? Authorizing Provider  Menthol, Topical Analgesic, (ICY HOT EX) Apply 1 application topically as needed (for back pain.).   Yes Historical Provider, MD  diazepam (VALIUM) 5 MG tablet Take 1 tablet (5 mg total) by mouth every 6 (six) hours as needed for muscle spasms. 11/10/14   Arman FilterGail K Kashonda Sarkisyan, NP   BP 123/71 mmHg  Pulse 70  Temp(Src) 98.2 F (36.8 C) (Oral)  Resp 20  SpO2 100% Physical Exam  Constitutional: He is oriented to person, place, and time. He appears well-developed and well-nourished.  HENT:  Head: Normocephalic.  Eyes: Pupils are equal, round, and reactive to light.  Neck: Normal range of motion.  Cardiovascular: Normal rate.   Pulmonary/Chest: Effort normal.  Musculoskeletal: Normal range of motion. He exhibits no edema or tenderness.  Pain cannot be reproduced with palpation.  Back can be repeated with twisting or bending forward  Neurological: He is alert and oriented to person, place, and time.  Skin: Skin is warm and dry.  Nursing note and vitals reviewed.   ED Course  Procedures (including critical care time) Labs Review  Labs Reviewed - No data to display  Imaging Review No results found.   EKG Interpretation None     Erik Sims reports intolerance to steroids, which is unfortunate.  He'll be instructed to use ibuprofen on a regular basis along with Valium as a antispasmodic At the last minute reports that his girlfriend just called and  reported + chlamydia   Patient is symptom free will treat with  Azithromycin  MDM   Final diagnoses:  Lumbosacral pain  STD exposure         Arman Filter, NP 11/10/14 4098  Loren Racer, MD 11/10/14 [phone]

## 2014-11-10 NOTE — ED Notes (Signed)
Bed: WA03 Expected date:  Expected time:  Means of arrival:  Comments: 

## 2015-05-27 ENCOUNTER — Encounter (HOSPITAL_COMMUNITY): Payer: Self-pay | Admitting: Emergency Medicine

## 2015-05-27 ENCOUNTER — Emergency Department (HOSPITAL_COMMUNITY): Payer: Self-pay

## 2015-05-27 ENCOUNTER — Emergency Department (HOSPITAL_COMMUNITY)
Admission: EM | Admit: 2015-05-27 | Discharge: 2015-05-27 | Disposition: A | Discharge status: Home / Self Care | Payer: Self-pay | Attending: Emergency Medicine | Admitting: Emergency Medicine

## 2015-05-27 DIAGNOSIS — Y998 Other external cause status: Secondary | ICD-10-CM | POA: Insufficient documentation

## 2015-05-27 DIAGNOSIS — Y9389 Activity, other specified: Secondary | ICD-10-CM | POA: Insufficient documentation

## 2015-05-27 DIAGNOSIS — S6292XS Unspecified fracture of left wrist and hand, sequela: Secondary | ICD-10-CM | POA: Insufficient documentation

## 2015-05-27 DIAGNOSIS — S62102A Fracture of unspecified carpal bone, left wrist, initial encounter for closed fracture: Secondary | ICD-10-CM

## 2015-05-27 DIAGNOSIS — S62102S Fracture of unspecified carpal bone, left wrist, sequela: Secondary | ICD-10-CM

## 2015-05-27 DIAGNOSIS — Y929 Unspecified place or not applicable: Secondary | ICD-10-CM | POA: Insufficient documentation

## 2015-05-27 DIAGNOSIS — Z720 Tobacco use: Secondary | ICD-10-CM | POA: Insufficient documentation

## 2015-05-27 DIAGNOSIS — S52592A Other fractures of lower end of left radius, initial encounter for closed fracture: Secondary | ICD-10-CM | POA: Insufficient documentation

## 2015-05-27 DIAGNOSIS — M79602 Pain in left arm: Secondary | ICD-10-CM

## 2015-05-27 DIAGNOSIS — W1839XA Other fall on same level, initial encounter: Secondary | ICD-10-CM | POA: Insufficient documentation

## 2015-05-27 DIAGNOSIS — W1839XS Other fall on same level, sequela: Secondary | ICD-10-CM | POA: Insufficient documentation

## 2015-05-27 MED ORDER — HYDROCODONE-ACETAMINOPHEN 5-325 MG PO TABS: 1.0000 | ORAL_TABLET | Freq: Three times a day (TID) | ORAL | Status: DC | PRN

## 2015-05-27 MED ORDER — DIAZEPAM 5 MG PO TABS: 5.0000 mg | ORAL_TABLET | Freq: Two times a day (BID) | ORAL | Status: DC | PRN

## 2015-05-27 MED ORDER — IBUPROFEN 600 MG PO TABS: 600.0000 mg | ORAL_TABLET | Freq: Four times a day (QID) | ORAL | Status: DC | PRN

## 2015-05-27 MED ORDER — IBUPROFEN 400 MG PO TABS
600.0000 mg | ORAL_TABLET | Freq: Once | ORAL | Status: AC
  Administered 2015-05-27: 600 mg via ORAL
  Filled 2015-05-27 (×2): qty 1

## 2015-05-27 NOTE — Discharge Instructions (Signed)
Cast or Splint Care Casts and splints support injured limbs and keep bones from moving while they heal.  HOME CARE  Keep the cast or splint uncovered during the drying period.  A plaster cast can take 24 to 48 hours to dry.  A fiberglass cast will dry in less than 1 hour.  Do not rest the cast on anything harder than a pillow for 24 hours.  Do not put weight on your injured limb. Do not put pressure on the cast. Wait for your doctor's approval.  Keep the cast or splint dry.  Cover the cast or splint with a plastic bag during baths or wet weather.  If you have a cast over your chest and belly (trunk), take sponge baths until the cast is taken off.  If your cast gets wet, dry it with a towel or blow dryer. Use the cool setting on the blow dryer.  Keep your cast or splint clean. Wash a dirty cast with a damp cloth.  Do not put any objects under your cast or splint.  Do not scratch the skin under the cast with an object. If itching is a problem, use a blow dryer on a cool setting over the itchy area.  Do not trim or cut your cast.  Do not take out the padding from inside your cast.  Exercise your joints near the cast as told by your doctor.  Raise (elevate) your injured limb on 1 or 2 pillows for the first 1 to 3 days. GET HELP IF:  Your cast or splint cracks.  Your cast or splint is too tight or too loose.  You itch badly under the cast.  Your cast gets wet or has a soft spot.  You have a bad smell coming from the cast.  You get an object stuck under the cast.  Your skin around the cast becomes red or sore.  You have new or more pain after the cast is put on. GET HELP RIGHT AWAY IF:  You have fluid leaking through the cast.  You cannot move your fingers or toes.  Your fingers or toes turn blue or white or are cool, painful, or puffy (swollen).  You have tingling or lose feeling (numbness) around the injured area.  You have bad pain or pressure under the  cast.  You have trouble breathing or have shortness of breath.  You have chest pain. Document Released: 01/31/2011 Document Revised: 06/03/2013 Document Reviewed: 04/09/2013 [facility] Patient Information 2015 [place], [place]. This information is not intended to replace advice given to you by your health care provider. Make sure you discuss any questions you have with your health care provider. Your x-ray shows that you have a nondisplaced fracture in your left wrist.  It has been splinted and you've been supplied with a sling as well you have been given referral to the hand surgeon to monitor healing and for cast placement in the next 1-2 days.  Please called in the morning to set an appointment for further evaluation

## 2015-05-27 NOTE — ED Notes (Signed)
Paged ortho 

## 2015-05-27 NOTE — ED Notes (Signed)
Pt fell of balance board around 4pm Thursday, pain from left elbow to wrist.  Has taken an Ibprofen but did not touch pain.

## 2015-05-27 NOTE — ED Notes (Addendum)
Patient c/o worsened arm pain since this morning.  Pt fell off hoverboard Thursday around 4pm, injuring his left wrist.  Pt came into [facility] at 1am this morning and was diagnosed with broken wrist, given ibuprofen and discharged.  Pt states his arm has felt worse as the day progressed and he "can't bear it anymore."  Pt has left arm wrapped with ace bandage and in sling.  CMS intact.

## 2015-05-27 NOTE — ED Provider Notes (Signed)
CSN: [id number]     Arrival date & time 05/27/15  0003 History   First MD Initiated Contact with Patient 05/27/15 0036     Chief Complaint  Patient presents with  . Fall     (Consider location/radiation/quality/duration/timing/severity/associated sxs/prior Treatment) HPI Comments: Normally healthy 39 year old male who was on a balance board.  This afternoon around 4:00, falling backwards, catching himself with an outstretched left hand.  He's been having wrist pain since that time.  No numbness or tingling.  No obvious deformity.  He slowly has had some swelling over the joint itself  Patient is a 39 y.o. male presenting with fall. The history is provided by the patient.  Fall This is a new problem. The current episode started today. The problem occurs constantly. The problem has been unchanged. Associated symptoms include joint swelling. Pertinent negatives include no numbness. The symptoms are aggravated by exertion. He has tried nothing for the symptoms. The treatment provided no relief.    Past Medical History  Diagnosis Date  . Back pain    History reviewed. No pertinent past surgical history. No family history on file. Social History  Substance Use Topics  . Smoking status: Current Every Day Smoker -- 0.00 packs/day    Types: Cigarettes  . Smokeless tobacco: None  . Alcohol Use: Yes    Review of Systems  Musculoskeletal: Positive for joint swelling.  Neurological: Negative for numbness.  All other systems reviewed and are negative.     Allergies  Prednisone  Home Medications   Prior to Admission medications   Medication Sig Start Date End Date Taking? Authorizing Provider  diazepam (VALIUM) 5 MG tablet Take 1 tablet (5 mg total) by mouth every 6 (six) hours as needed for muscle spasms. 11/10/14   Earley Favor, NP  ibuprofen (ADVIL,MOTRIN) 600 MG tablet Take 1 tablet (600 mg total) by mouth every 6 (six) hours as needed. 05/27/15   Earley Favor, NP  Menthol, Topical  Analgesic, (ICY HOT EX) Apply 1 application topically as needed (for back pain.).    Historical Provider, MD   BP 119/83 mmHg  Pulse 98  Temp(Src) 98.8 F (37.1 C) (Oral)  Resp 20  SpO2 98% Physical Exam  Constitutional: He appears well-developed and well-nourished.  HENT:  Head: Normocephalic.  Eyes: Pupils are equal, round, and reactive to light.  Neck: Normal range of motion.  Cardiovascular: Normal rate and regular rhythm.   Pulmonary/Chest: Effort normal.  Abdominal: Soft.  Musculoskeletal: He exhibits tenderness. He exhibits no edema.       Arms: Neurological: He is alert.  Skin: Skin is warm.  Vitals reviewed.   ED Course  Procedures (including critical care time) Labs Review Labs Reviewed - No data to display  Imaging Review Dg Wrist Complete Left  05/27/2015   CLINICAL DATA:  Pain following fall from hoverboard  EXAM: LEFT WRIST - COMPLETE 3+ VIEW  COMPARISON:  None.  FINDINGS: Frontal, oblique, lateral, and ulnar deviation scaphoid images were obtained. There is a mildly comminuted fracture of the medial aspect of the distal radial metaphysis with fracture extension into the radiocarpal joint. Alignment is essentially anatomic. No other fractures. No dislocation. Joint spaces appear intact.  IMPRESSION: Nondisplaced fracture of the medial distal radius with fracture extending into the radiocarpal joint. No dislocation. No appreciable arthropathy.   Electronically Signed   By: Bretta Bang III M.D.   On: 05/27/2015 00:51   I, Dash Cardarelli K, personally reviewed and evaluated these images and lab results as  part of my medical decision-making.   EKG Interpretation None     Patient has nondisplaced wrist fracture.  He's been placed in a sugar tong splint by orthopedic tech patient was examined after splint placed.  Good distal cap refill, warm to touch.  Full range of motion of the fingers and thumb. He has been given a prescription for ibuprofen for pain control.   Sling for comfort.  Return parameters and referral to be the orthopedic surgery for cast placement and follow-up MDM   Final diagnoses:  Wrist fracture, left, closed, initial encounter         Earley Favor, NP 05/27/15 0125  Derwood Kaplan, MD 05/27/15 [phone]

## 2015-05-27 NOTE — ED Notes (Addendum)
PA at bedside.  PA to see patient before RN assessment.

## 2015-05-27 NOTE — ED Provider Notes (Signed)
CSN: [id number]     Arrival date & time 05/27/15  2014 History  This chart was scribed for Erik Forth, PA-C, working with Vanetta Mulders, MD by Elon Spanner, ED Scribe. This patient was seen in room TR10C/TR10C and the patient's care was started at 8:37 PM.   Chief Complaint  Patient presents with  . Arm Pain   The history is provided by medical records and the patient. No language interpreter was used.    HPI Comments: Erik Sims is a 39 y.o. male who presents to the Emergency Department complaining of aching, 7/10 left wrist pain onset two days ago after the patient fell off an athletic balance board and caught himself with an outstretched hand.  The patient was seen in the ED early this morning, dx'd with non-displaced wrist fracture, prescribed ibuprofen, given asugar tong splint, and provided a referral to orthopaedic surgery. He has used the ibuprofen without relief and intermittently iced the complaint.  There is some associated finger swelling.  Patient returns to the ED today with the primary goal of addressing his continued pain.  He has been unable to sleep due to pain.  He has taken narcotic pain medication for a prior injury but does not take any on a regular basis.    Past Medical History  Diagnosis Date  . Back pain    History reviewed. No pertinent past surgical history. History reviewed. No pertinent family history. Social History  Substance Use Topics  . Smoking status: Current Every Day Smoker -- 0.00 packs/day    Types: Cigarettes  . Smokeless tobacco: None  . Alcohol Use: Yes    Review of Systems  Constitutional: Negative for fever and chills.  Gastrointestinal: Negative for nausea and vomiting.  Musculoskeletal: Positive for joint swelling and arthralgias. Negative for back pain, neck pain and neck stiffness.  Skin: Negative for wound.  Neurological: Negative for numbness.  Hematological: Does not bruise/bleed easily.  Psychiatric/Behavioral: The  patient is not nervous/anxious.   All other systems reviewed and are negative.     Allergies  Prednisone  Home Medications   Prior to Admission medications   Medication Sig Start Date End Date Taking? Authorizing Provider  diazepam (VALIUM) 5 MG tablet Take 1 tablet (5 mg total) by mouth every 12 (twelve) hours as needed for muscle spasms. 05/27/15   Inda Mcglothen, PA-C  HYDROcodone-acetaminophen (NORCO/VICODIN) 5-325 MG per tablet Take 1 tablet by mouth every 8 (eight) hours as needed for severe pain. 05/27/15   Mats Jeanlouis, PA-C  ibuprofen (ADVIL,MOTRIN) 600 MG tablet Take 1 tablet (600 mg total) by mouth every 6 (six) hours as needed. 05/27/15   Earley Favor, NP  Menthol, Topical Analgesic, (ICY HOT EX) Apply 1 application topically as needed (for back pain.).    Historical Provider, MD   BP 120/68 mmHg  Pulse 82  Temp(Src) 98.5 F (36.9 C) (Oral)  Resp 16  SpO2 100% Physical Exam  Constitutional: He appears well-developed and well-nourished. No distress.  HENT:  Head: Normocephalic and atraumatic.  Eyes: Conjunctivae are normal.  Neck: Normal range of motion.  Cardiovascular: Normal rate, regular rhythm, normal heart sounds and intact distal pulses.   Capillary refill < 3 sec  Pulmonary/Chest: Effort normal and breath sounds normal.  Musculoskeletal: He exhibits tenderness. He exhibits no edema.  ROM: left arm in sling and splint.  FROM of fingers of left hand.  Cap refill < 3 seconds.  Sensation intact in left hand.    Neurological: He is alert.  Coordination normal.  Skin: Skin is warm and dry. He is not diaphoretic.  No tenting of the skin  Psychiatric: He has a normal mood and affect.  Nursing note and vitals reviewed.   ED Course  Procedures (including critical care time) Labs Review Labs Reviewed - No data to display  Imaging Review Dg Wrist Complete Left  05/27/2015   CLINICAL DATA:  Pain following fall from hoverboard  EXAM: LEFT WRIST - COMPLETE  3+ VIEW  COMPARISON:  None.  FINDINGS: Frontal, oblique, lateral, and ulnar deviation scaphoid images were obtained. There is a mildly comminuted fracture of the medial aspect of the distal radial metaphysis with fracture extension into the radiocarpal joint. Alignment is essentially anatomic. No other fractures. No dislocation. Joint spaces appear intact.  IMPRESSION: Nondisplaced fracture of the medial distal radius with fracture extending into the radiocarpal joint. No dislocation. No appreciable arthropathy.   Electronically Signed   By: Bretta Bang III M.D.   On: 05/27/2015 00:51   I personally reviewed and evaluated these images and lab results as part of my medical decision-making.   EKG Interpretation None      MDM   Final diagnoses:  Arm pain, left  Wrist fracture, closed, left, sequela   Erik Sims presents with persistent pain in his left wrist after fracture. Patient was discharged in emergency department with ibuprofen. Will give some muscle relaxer and narcotic pain control for the next several days. Patient already has an appointment scheduled with orthopedics on Monday.  Splint is checked in patient with warm fingers, good capillary refill and no evidence of significant swelling. Doubt restrictive cast as the cause of patient's pain.  BP 120/68 mmHg  Pulse 82  Temp(Src) 98.5 F (36.9 C) (Oral)  Resp 16  SpO2 100%   I personally performed the services described in this documentation, which was scribed in my presence. The recorded information has been reviewed and is accurate.    Dahlia Client Rafael Quesada, PA-C 05/28/15 0013  Vanetta Mulders, MD 06/01/15 [phone]

## 2015-05-27 NOTE — ED Notes (Signed)
Paged Ortho  

## 2015-05-27 NOTE — ED Notes (Signed)
Pt. lost his balance and fell while using a balance board this afternoon , no LOC /ambulatory , reports pain at left wrist radiating to forearm .

## 2015-05-27 NOTE — Discharge Instructions (Signed)
1. Medications: vicodin for severe pain not controlled with regular ibuprofen usage, valium for muscle spasm, usual home medications 2. Treatment: rest, drink plenty of fluids, ice, elevate 3. Follow Up: Please followup with the orthopedist at your appointment on Monday

## 2015-06-09 ENCOUNTER — Encounter: Payer: Self-pay | Admitting: Occupational Therapy

## 2015-06-09 ENCOUNTER — Ambulatory Visit: Payer: Self-pay | Attending: Orthopedic Surgery | Admitting: Occupational Therapy

## 2015-06-09 DIAGNOSIS — R29898 Other symptoms and signs involving the musculoskeletal system: Secondary | ICD-10-CM | POA: Insufficient documentation

## 2015-06-09 DIAGNOSIS — M25532 Pain in left wrist: Secondary | ICD-10-CM | POA: Insufficient documentation

## 2015-06-09 DIAGNOSIS — M25632 Stiffness of left wrist, not elsewhere classified: Secondary | ICD-10-CM | POA: Insufficient documentation

## 2015-06-09 NOTE — Patient Instructions (Signed)
SPLINT WEAR AND CARE   WEARING SCHEDULE:  Wear splint at ALL times except for hygiene care   PURPOSE:  To prevent movement and for protection until injury can heal  CARE OF SPLINT:  Keep splint away from heat sources including: stove, radiator or furnace, or a car in sunlight. The splint can melt and will no longer fit you properly  Keep away from pets and children  Clean the splint with rubbing alcohol 1-2 times per day.  * During this time, make sure you also clean your hand/arm as instructed by your therapist and/or perform dressing changes as needed. Then dry hand/arm completely before replacing splint. (When cleaning hand/arm, keep it immobilized in same position until splint is replaced)  PRECAUTIONS/POTENTIAL PROBLEMS: *If you notice or experience increased pain, swelling, numbness, or a lingering reddened area from the splint: Contact your therapist immediately by calling [phone]. You must wear the splint for protection, but we will get you scheduled for adjustments as quickly as possible.  (If only straps or hooks need to be replaced and NO adjustments to the splint need to be made, just call the office ahead and let them know you are coming in)  If you have any medical concerns or signs of infection, please call your doctor immediately    

## 2015-06-09 NOTE — Therapy (Signed)
[facility] [address] [place], [place], [address] Phone: [phone]   Fax:  [phone]  Occupational Therapy Evaluation  Patient Details  Name: Erik Sims MRN: [id number] Date of Birth: 1976/02/09 Referring Provider:  Betha Loa, MD  Encounter Date: 06/09/2015      OT End of Session - 06/09/15 0908    Visit Number 1   Number of Visits 8   Date for OT Re-Evaluation 08/08/15   Authorization Type self pay   OT Start Time 0800   OT Stop Time 0905   OT Time Calculation (min) 65 min   Equipment Utilized During Treatment splint   Activity Tolerance Patient tolerated treatment well      Past Medical History  Diagnosis Date  . Back pain     History reviewed. No pertinent past surgical history.  There were no vitals filed for this visit.  Visit Diagnosis:  Stiffness of wrist joint, left - Plan: Ot plan of care cert/re-cert  Pain in wrist joint, left - Plan: Ot plan of care cert/re-cert  Weakness of wrist - Plan: Ot plan of care cert/re-cert      Subjective Assessment - 06/09/15 0800    Pertinent History back pain   Patient Stated Goals to get my splint and get the strength back in my hand to go back to work easier   Currently in Pain? Yes   Pain Score 5    Pain Location Wrist   Pain Orientation Left   Pain Descriptors / Indicators Throbbing   Pain Type Acute pain   Pain Onset 1 to 4 weeks ago   Pain Frequency Intermittent   Aggravating Factors  nothing   Pain Relieving Factors pain meds           [facility] OT Assessment - 06/09/15 0001    Assessment   Diagnosis Lt distal radius fracture   Onset Date 05/26/15  no surgery   Assessment Pt arrived fully wrapped and protected at wrist and elbow.    Prior Therapy none   Precautions   Precautions Other (comment)   Precaution Comments keep wrist immobolized, splint on at all times (except hygiene care)   Required Braces or Orthoses Other  Brace/Splint   Other Brace/Splint static wrist splint (per MD orders).    Balance Screen   Has the patient fallen in the past 6 months No   Has the patient had a decrease in activity level because of a fear of falling?  No   Is the patient reluctant to leave their home because of a fear of falling?  No   Home  Environment   Lives With Family   Prior Function   Level of Independence Independent   Vocation Full time employment   Vocation Requirements details cars   ADL   ADL comments Mod I for BADLS and still working full time using Rt dominant hand   Written Expression   Dominant Hand Right   Edema   Edema none                  OT Treatments/Exercises (OP) - 06/09/15 0001    Splinting   Splinting Carefully removed soft cast/gauze that pt arrived in. Fabricated and fitted static wrist splint per MD orders while keeping wrist immobolized. Therapist kept wrist in neutral position and made clam shell style, per pt request, for extra protection. Issued splint               OT  Education - 06/09/15 0855    Education provided Yes   Education Details Splint wear and care, precautions   Person(s) Educated Patient   Methods Explanation;Demonstration;Handout   Comprehension Verbalized understanding          OT Short Term Goals - 06/09/15 0913    OT SHORT TERM GOAL #1   Title Independent w/ splint wear and care after 2 weeks wear to ensure proper fit   Time 4   Period Weeks   Status On-going   OT SHORT TERM GOAL #2   Title Pain less than or equal to 3/10 Lt wrist   Baseline 5/10   Time 4   Period Weeks   Status New           OT Long Term Goals - 06/09/15 0914    OT LONG TERM GOAL #1   Title Independent with HEP (if warranted by MD)   Time 8   Period Weeks   Status New   OT LONG TERM GOAL #2   Title Pt to demo at least 40 degrees wrist flexion and extension Lt wrist for functional activities   Time 8   Period Weeks   Status New   OT LONG TERM GOAL  #3   Title Pt to return to using Lt hand as assist for bilateral tasks    Time 8   Period Weeks   Status New               Plan - 06/09/15 0910    Clinical Impression Statement Pt is a 39 y.o. male who presents to outpatient rehab s/p Lt distal radius fracture (no surgery). Pt arrived on time for scheduled appt, fully wrapped and protected, for splinting purposes. Issued splint per MD orders   Pt will benefit from skilled therapeutic intervention in order to improve on the following deficits (Retired) Decreased range of motion;Impaired sensation;Decreased knowledge of precautions;Impaired UE functional use;Pain;Decreased mobility;Decreased strength   Rehab Potential Good   OT Frequency 1x / week   OT Duration 8 weeks  prn   OT Treatment/Interventions Self-care/ADL training;Patient/family education;Therapeutic exercise;Splinting;Manual Therapy;Ultrasound;Therapeutic activities;DME and/or AE instruction;Parrafin;Cryotherapy;Electrical Stimulation;Fluidtherapy;Passive range of motion;Moist Heat   Plan splint adjustments prn. If pt returns for ongoing therapy (other than splint adjustments) will need clearance from MD   Consulted and Agree with Plan of Care Patient        Problem List There are no active problems to display for this patient.   Kelli Churn, OTR/L 06/09/2015, 9:19 AM  [facility] [address] [place], [place], [address] Phone: [phone]   Fax:  [phone]

## 2015-08-16 ENCOUNTER — Encounter: Payer: Self-pay | Admitting: Occupational Therapy

## 2015-08-16 NOTE — Therapy (Signed)
[facility] [address] [place], [place], [address] Phone: [phone]   Fax:  [phone]  Patient Details  Name: Erik MinsCharles Guthridge MRN: [id number] Date of Birth: 02/23/1976 Referring Provider:  Betha LoaKevin Kuzma  Encounter Date: 08/16/2015   Pt did not return after initial evaluation and splinting on 06/09/15, therefore will close this episode of care.   Kelli ChurnBallie, Emily Massar Johnson, OTR/L 08/16/2015, 9:45 AM  [facility] [address] [place], [place], [address] Phone: [phone]   Fax:  [phone]

## 2015-10-29 ENCOUNTER — Emergency Department (HOSPITAL_COMMUNITY)
Admission: EM | Admit: 2015-10-29 | Discharge: 2015-10-29 | Disposition: A | Discharge status: Home / Self Care | Payer: Self-pay | Attending: Emergency Medicine | Admitting: Emergency Medicine

## 2015-10-29 ENCOUNTER — Encounter (HOSPITAL_COMMUNITY): Payer: Self-pay | Admitting: *Deleted

## 2015-10-29 DIAGNOSIS — M79651 Pain in right thigh: Secondary | ICD-10-CM | POA: Insufficient documentation

## 2015-10-29 DIAGNOSIS — F17210 Nicotine dependence, cigarettes, uncomplicated: Secondary | ICD-10-CM | POA: Insufficient documentation

## 2015-10-29 DIAGNOSIS — J069 Acute upper respiratory infection, unspecified: Secondary | ICD-10-CM | POA: Insufficient documentation

## 2015-10-29 DIAGNOSIS — M79652 Pain in left thigh: Secondary | ICD-10-CM | POA: Insufficient documentation

## 2015-10-29 MED ORDER — PSEUDOEPHEDRINE-GUAIFENESIN ER 60-600 MG PO TB12: 1.0000 | ORAL_TABLET | Freq: Two times a day (BID) | ORAL | Status: DC

## 2015-10-29 NOTE — Discharge Instructions (Signed)
Please take your medications as prescribed. Follow-up with your doctor as needed. Return to ED for any new or worsening symptoms.  Upper Respiratory Infection, Adult Most upper respiratory infections (URIs) are a viral infection of the air passages leading to the lungs. A URI affects the nose, throat, and upper air passages. The most common type of URI is nasopharyngitis and is typically referred to as "the common cold." URIs run their course and usually go away on their own. Most of the time, a URI does not require medical attention, but sometimes a bacterial infection in the upper airways can follow a viral infection. This is called a secondary infection. Sinus and middle ear infections are common types of secondary upper respiratory infections. Bacterial pneumonia can also complicate a URI. A URI can worsen asthma and chronic obstructive pulmonary disease (COPD). Sometimes, these complications can require emergency medical care and may be life threatening.  CAUSES Almost all URIs are caused by viruses. A virus is a type of germ and can spread from one person to another.  RISKS FACTORS You may be at risk for a URI if:   You smoke.   You have chronic heart or lung disease.  You have a weakened defense (immune) system.   You are very young or very old.   You have nasal allergies or asthma.  You work in crowded or poorly ventilated areas.  You work in health care facilities or schools. SIGNS AND SYMPTOMS  Symptoms typically develop 2-3 days after you come in contact with a cold virus. Most viral URIs last 7-10 days. However, viral URIs from the influenza virus (flu virus) can last 14-18 days and are typically more severe. Symptoms may include:   Runny or stuffy (congested) nose.   Sneezing.   Cough.   Sore throat.   Headache.   Fatigue.   Fever.   Loss of appetite.   Pain in your forehead, behind your eyes, and over your cheekbones (sinus pain).  Muscle aches.   DIAGNOSIS  Your health care provider may diagnose a URI by:  Physical exam.  Tests to check that your symptoms are not due to another condition such as:  Strep throat.  Sinusitis.  Pneumonia.  Asthma. TREATMENT  A URI goes away on its own with time. It cannot be cured with medicines, but medicines may be prescribed or recommended to relieve symptoms. Medicines may help:  Reduce your fever.  Reduce your cough.  Relieve nasal congestion. HOME CARE INSTRUCTIONS   Take medicines only as directed by your health care provider.   Gargle warm saltwater or take cough drops to comfort your throat as directed by your health care provider.  Use a warm mist humidifier or inhale steam from a shower to increase air moisture. This may make it easier to breathe.  Drink enough fluid to keep your urine clear or pale yellow.   Eat soups and other clear broths and maintain good nutrition.   Rest as needed.   Return to work when your temperature has returned to normal or as your health care provider advises. You may need to stay home longer to avoid infecting others. You can also use a face mask and careful hand washing to prevent spread of the virus.  Increase the usage of your inhaler if you have asthma.   Do not use any tobacco products, including cigarettes, chewing tobacco, or electronic cigarettes. If you need help quitting, ask your health care provider. PREVENTION  The best way to  protect yourself from getting a cold is to practice good hygiene.   Avoid oral or hand contact with people with cold symptoms.   Wash your hands often if contact occurs.  There is no clear evidence that vitamin C, vitamin E, echinacea, or exercise reduces the chance of developing a cold. However, it is always recommended to get plenty of rest, exercise, and practice good nutrition.  SEEK MEDICAL CARE IF:   You are getting worse rather than better.   Your symptoms are not controlled by  medicine.   You have chills.  You have worsening shortness of breath.  You have brown or red mucus.  You have yellow or brown nasal discharge.  You have pain in your face, especially when you bend forward.  You have a fever.  You have swollen neck glands.  You have pain while swallowing.  You have white areas in the back of your throat. SEEK IMMEDIATE MEDICAL CARE IF:   You have severe or persistent:  Headache.  Ear pain.  Sinus pain.  Chest pain.  You have chronic lung disease and any of the following:  Wheezing.  Prolonged cough.  Coughing up blood.  A change in your usual mucus.  You have a stiff neck.  You have changes in your:  Vision.  Hearing.  Thinking.  Mood. MAKE SURE YOU:   Understand these instructions.  Will watch your condition.  Will get help right away if you are not doing well or get worse.   This information is not intended to replace advice given to you by your health care provider. Make sure you discuss any questions you have with your health care provider.   Document Released: 03/27/2001 Document Revised: 02/15/2015 Document Reviewed: 01/06/2014 Elsevier Interactive Patient Education [employer]2016 [employer]vier Inc.

## 2015-10-29 NOTE — ED Notes (Signed)
Declined W/C at D/C and was escorted to lobby by RN. 

## 2015-10-29 NOTE — ED Notes (Signed)
Pt c/o non productive cough onset yesterday, pt c/o chest pain with cough, HA, pt reports coworkers being sick with similar symptoms, pt denies n/v/d, vitals WNL, A&O x4

## 2015-10-29 NOTE — ED Notes (Signed)
See PA assessment 

## 2015-10-29 NOTE — ED Provider Notes (Signed)
CSN: [id number]     Arrival date & time 10/29/15  1323 History  By signing my name below, I, Elon SpannerGarrett Cook, attest that this documentation has been prepared under the direction and in the presence of General MillsBenjamin Nashali Ditmer, PA-C. Electronically Signed: Elon SpannerGarrett Cook ED Scribe. 10/29/2015. 3:18 PM.    Chief Complaint  Patient presents with  . URI   The history is provided by the patient. No language interpreter was used.   HPI Comments: Erik Sims is a 40 y.o. male with no chronic conditions who presents to the Emergency Department complaining of a persistent, unchanged dry cough onset yesterday.  Associated symptoms include sore throat, nasal congestion, fatigue, and aching in the bilateral thighs.  His symptoms have been mildly improved by intermittent use of Robitussin.  Patient uses tobacco and marijuana (last use yesterday).  He denies fever.  Patient reports positive sick contacts with coworkers who have similar symptoms.  Past Medical History  Diagnosis Date  . Back pain    History reviewed. No pertinent past surgical history. No family history on file. Social History  Substance Use Topics  . Smoking status: Current Every Day Smoker -- 0.50 packs/day    Types: Cigarettes  . Smokeless tobacco: None  . Alcohol Use: 4.2 oz/week    7 Cans of beer per week    Review of Systems A complete 10 system review of systems was obtained and all systems are negative except as noted in the HPI and PMH.   Allergies  Prednisone  Home Medications   Prior to Admission medications   Medication Sig Start Date End Date Taking? Authorizing Provider  diazepam (VALIUM) 5 MG tablet Take 1 tablet (5 mg total) by mouth every 12 (twelve) hours as needed for muscle spasms. Patient not taking: Reported on 06/09/2015 05/27/15   Dahlia ClientHannah Muthersbaugh, PA-C  HYDROcodone-acetaminophen (NORCO/VICODIN) 5-325 MG per tablet Take 1 tablet by mouth every 8 (eight) hours as needed for severe pain. 05/27/15   Hannah  Muthersbaugh, PA-C  ibuprofen (ADVIL,MOTRIN) 600 MG tablet Take 1 tablet (600 mg total) by mouth every 6 (six) hours as needed. Patient not taking: Reported on 06/09/2015 05/27/15   Earley FavorGail Schulz, NP  Menthol, Topical Analgesic, (ICY HOT EX) Apply 1 application topically as needed (for back pain.).    Historical Provider, MD  pseudoephedrine-guaifenesin (MUCINEX D) 60-600 MG 12 hr tablet Take 1 tablet by mouth every 12 (twelve) hours. 10/29/15   Joycie PeekBenjamin Fernando Torry, PA-C   BP 119/71 mmHg  Pulse 80  Temp(Src) 98 F (36.7 C) (Oral)  Resp 16  Ht 5\' 9"  (1.753 m)  Wt 62.143 kg  BMI 20.22 kg/m2  SpO2 100% Physical Exam  Constitutional: He is oriented to person, place, and time. He appears well-developed and well-nourished. No distress.  HENT:  Head: Normocephalic and atraumatic.  Mouth/Throat: Oropharynx is clear and moist.  Eyes: Conjunctivae and EOM are normal.  Neck: Normal range of motion. Neck supple. No tracheal deviation present.  Cardiovascular: Normal rate, regular rhythm and normal heart sounds.   Pulmonary/Chest: Effort normal. No respiratory distress.  Lungs CTA bilaterally.  Musculoskeletal: Normal range of motion. He exhibits no edema.  No edema or unilateral leg swelling. No skin discoloration  Neurological: He is alert and oriented to person, place, and time.  Skin: Skin is warm and dry.  Psychiatric: He has a normal mood and affect. His behavior is normal.  Nursing note and vitals reviewed.   ED Course  Procedures (including critical care time)  DIAGNOSTIC STUDIES:  Oxygen Saturation is 100% on room air, normal by my interpretation.    COORDINATION OF CARE:  3:33 PM Discussed suspicion of viral etiology.  Patient should continue using Robitussin.  Will prescribe Mucinex.  Patient acknowledges and agrees with plan.     Labs Review Labs Reviewed - No data to display  Imaging Review No results found. I have personally reviewed and evaluated these images and lab results  as part of my medical decision-making.   EKG Interpretation None     Filed Vitals:   10/29/15 1441 10/29/15 1628  BP:  119/71  Pulse: 87 80  Temp: 98.6 F (37 C) 98 F (36.7 C)  TempSrc: Oral Oral  Resp: 16   Height: 5\' 9"  (1.753 m)   Weight: 62.143 kg   SpO2: 99% 100%   Meds given in ED:  Medications - No data to display  Discharge Medication List as of 10/29/2015  3:40 PM    START taking these medications   Details  pseudoephedrine-guaifenesin (MUCINEX D) 60-600 MG 12 hr tablet Take 1 tablet by mouth every 12 (twelve) hours., Starting 10/29/2015, Until Discontinued, Print        MDM  Patients symptoms are consistent with URI, likely viral etiology. PERC negative . Benign cardiopulmonary exam. No further imaging indicated. Discussed that antibiotics are not indicated for viral infections. Pt will be discharged with symptomatic treatment.  Verbalizes understanding and is agreeable with plan. Pt is hemodynamically stable, afebrile & in NAD prior to dc.  Final diagnoses:  URI (upper respiratory infection)    I personally performed the services described in this documentation, which was scribed in my presence. The recorded information has been reviewed and is accurate.    Joycie Peek, PA-C 10/30/15 1610  Gerhard Munch, MD 10/30/15 [phone]

## 2016-07-20 IMAGING — DX DG WRIST COMPLETE 3+V*L*
4 series · 4 of 4 positions shown · non-contrast
Comparison: None.

CLINICAL DATA: Pain following fall from hoverboard

EXAM:
LEFT WRIST - COMPLETE 3+ VIEW

[wrist pa]
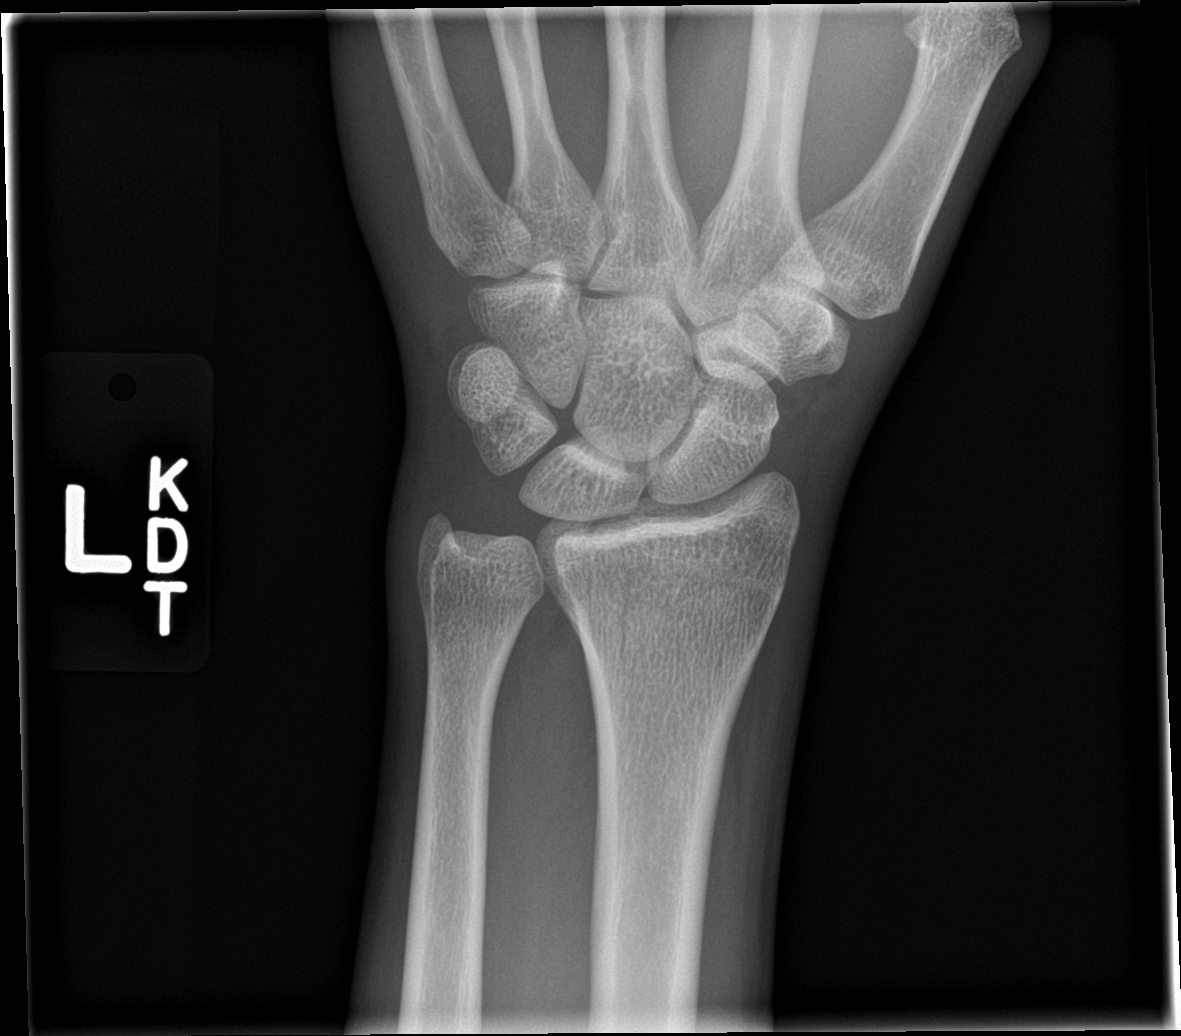

[wrist obl]
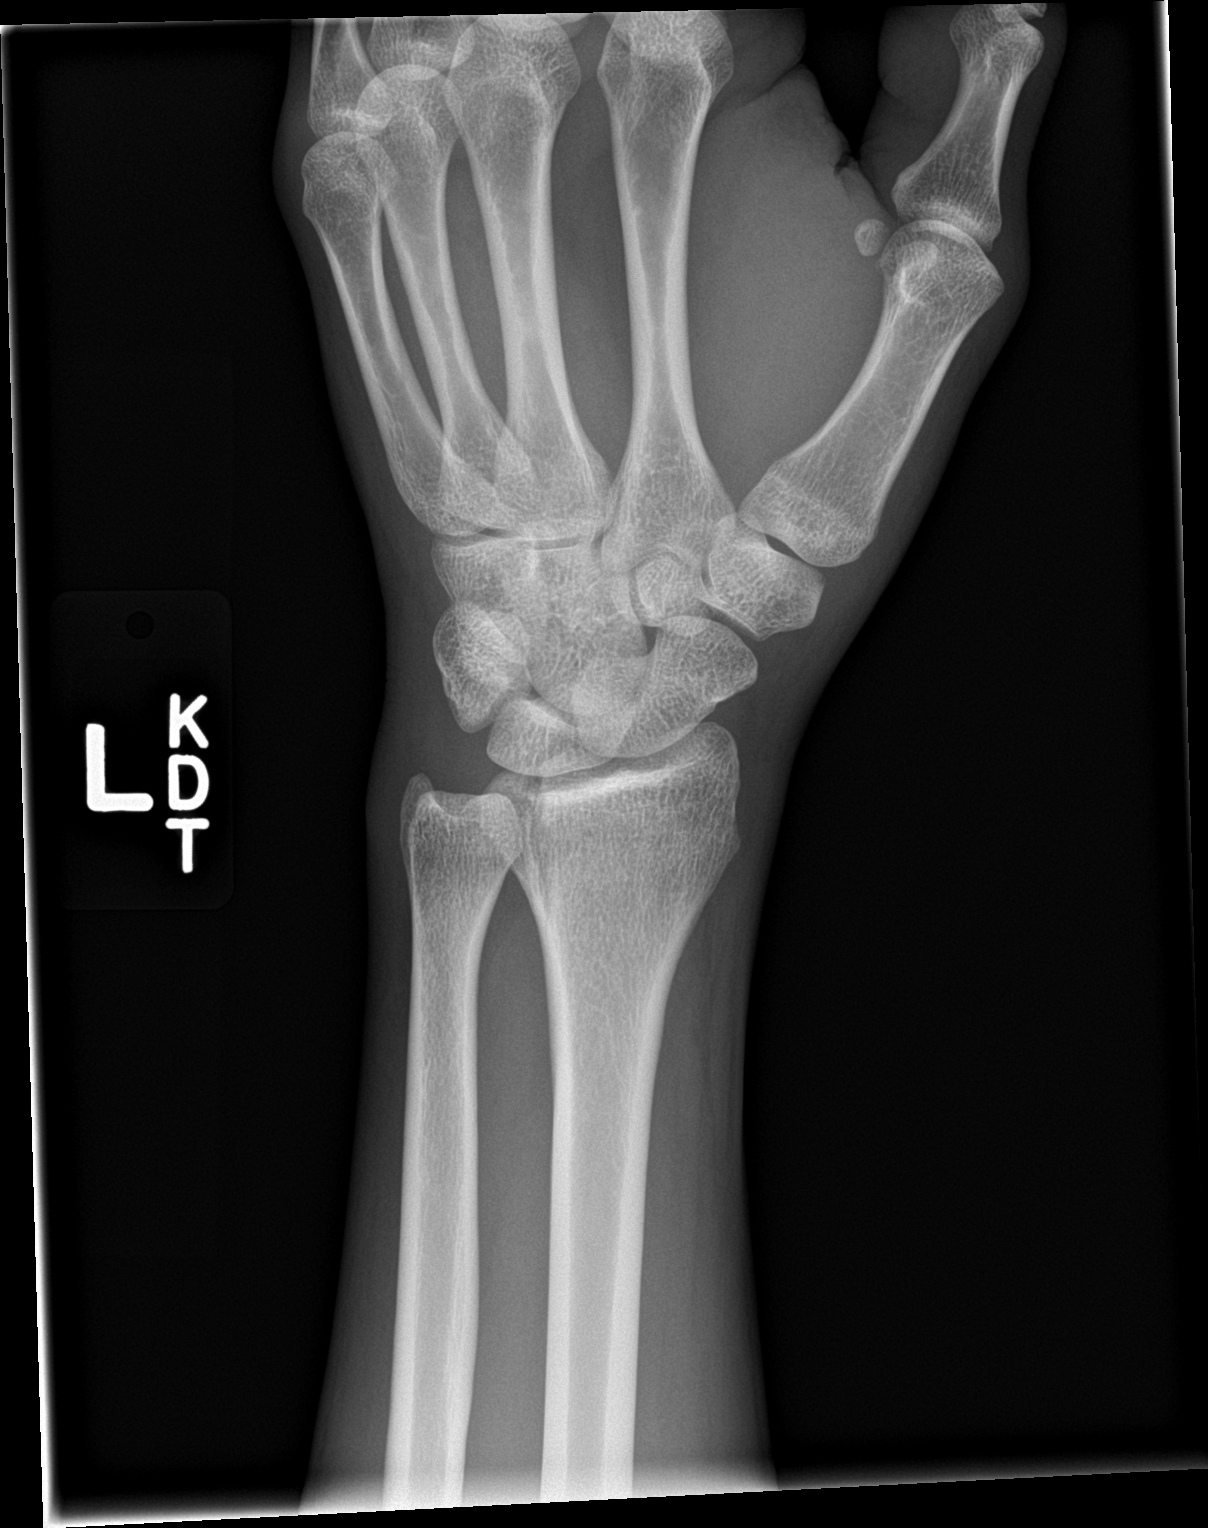

[wrist lat]
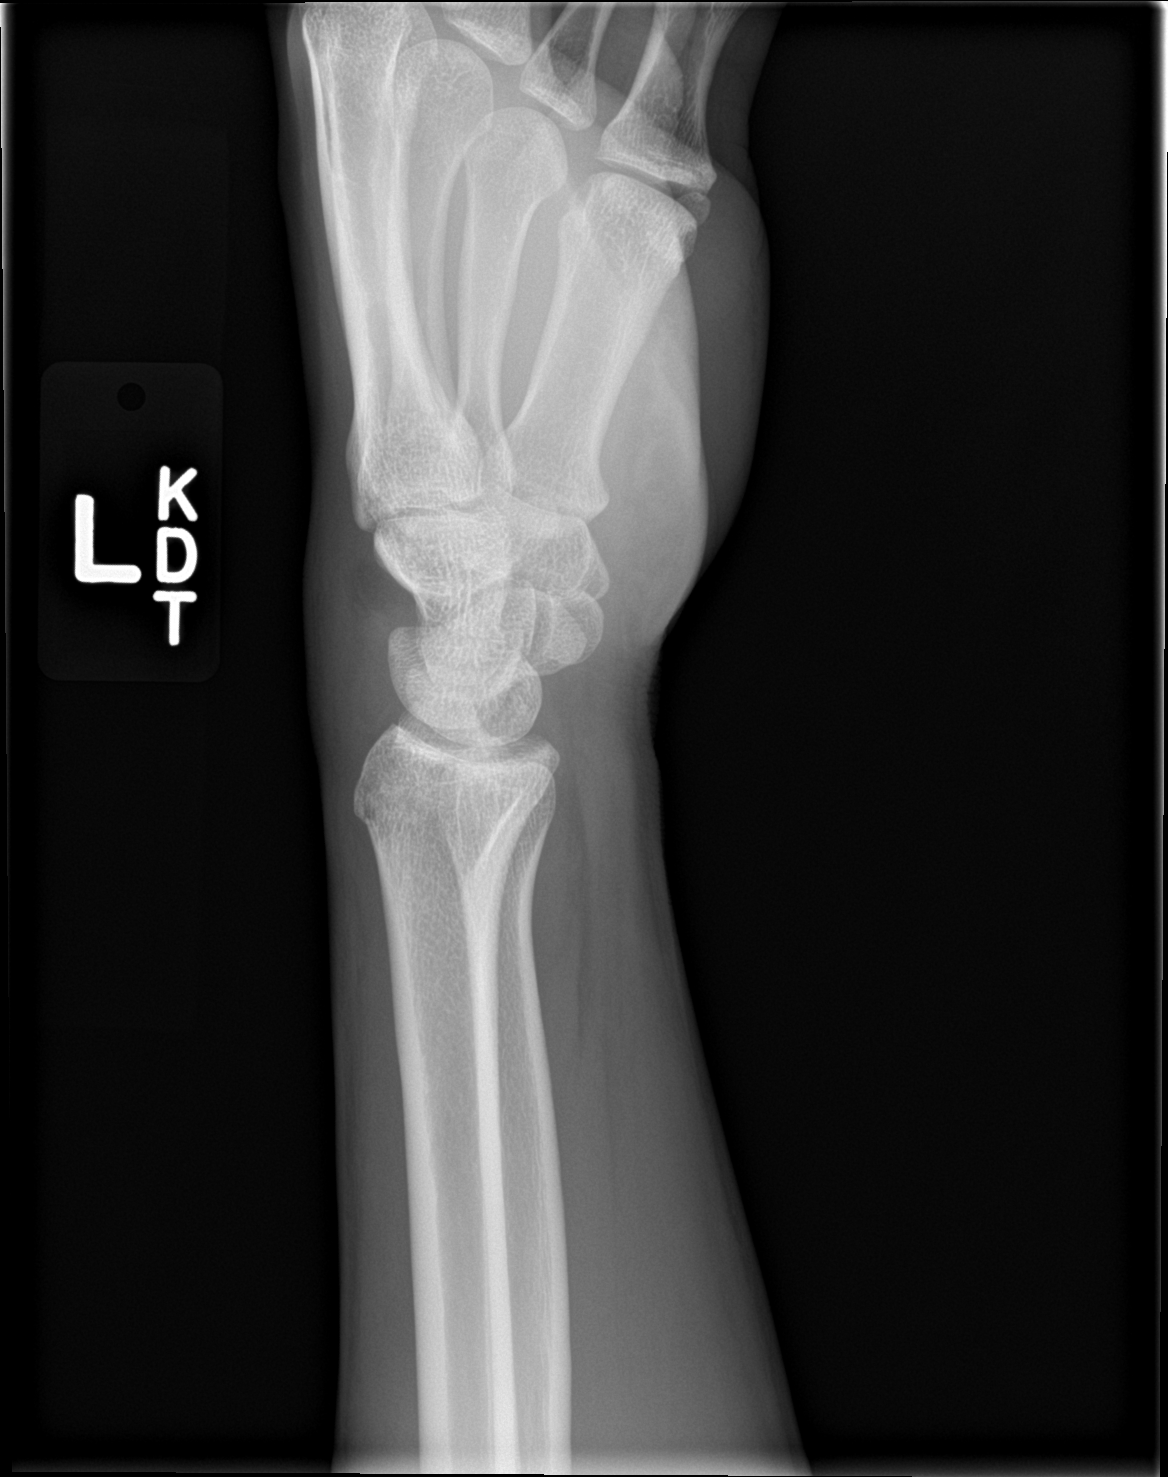

[wrist navicular]
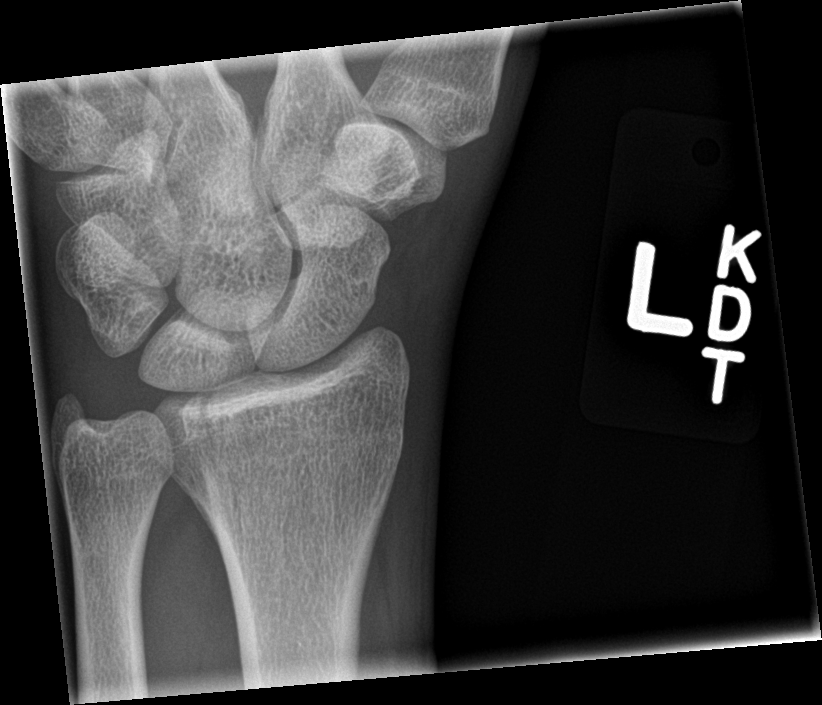

[4 of 4 positions shown; findings below may reference images not displayed]

FINDINGS: Frontal, oblique, lateral, and ulnar deviation scaphoid images were
obtained. There is a mildly comminuted fracture of the medial aspect
of the distal radial metaphysis with fracture extension into the
radiocarpal joint. Alignment is essentially anatomic. No other
fractures. No dislocation. Joint spaces appear intact.
IMPRESSION: Nondisplaced fracture of the medial distal radius with fracture
extending into the radiocarpal joint. No dislocation. No appreciable
arthropathy.

## 2017-11-12 ENCOUNTER — Emergency Department (HOSPITAL_COMMUNITY)
Admission: EM | Admit: 2017-11-12 | Discharge: 2017-11-12 | Disposition: A | Discharge status: Home / Self Care | Payer: Self-pay | Attending: Emergency Medicine | Admitting: Emergency Medicine

## 2017-11-12 ENCOUNTER — Other Ambulatory Visit: Payer: Self-pay

## 2017-11-12 ENCOUNTER — Encounter (HOSPITAL_COMMUNITY): Payer: Self-pay | Admitting: Emergency Medicine

## 2017-11-12 DIAGNOSIS — K0889 Other specified disorders of teeth and supporting structures: Secondary | ICD-10-CM

## 2017-11-12 DIAGNOSIS — Z79899 Other long term (current) drug therapy: Secondary | ICD-10-CM | POA: Insufficient documentation

## 2017-11-12 DIAGNOSIS — J029 Acute pharyngitis, unspecified: Secondary | ICD-10-CM

## 2017-11-12 DIAGNOSIS — F17210 Nicotine dependence, cigarettes, uncomplicated: Secondary | ICD-10-CM | POA: Insufficient documentation

## 2017-11-12 LAB — RAPID STREP SCREEN (MED CTR MEBANE ONLY): STREPTOCOCCUS, GROUP A SCREEN (DIRECT): NEGATIVE

## 2017-11-12 MED ORDER — DICLOFENAC SODIUM 75 MG PO TBEC: 75.0000 mg | DELAYED_RELEASE_TABLET | Freq: Two times a day (BID) | ORAL | 0 refills | Status: DC

## 2017-11-12 MED ORDER — AMOXICILLIN 500 MG PO CAPS: 500.0000 mg | ORAL_CAPSULE | Freq: Three times a day (TID) | ORAL | 0 refills | Status: DC

## 2017-11-12 NOTE — ED Triage Notes (Signed)
Pt c/o sore throat and pain with swallowing x 1 week.

## 2017-11-12 NOTE — ED Provider Notes (Signed)
[facility] Provider Note   CSN: [id number] Arrival date & time: 11/12/17  0630     History   Chief Complaint Chief Complaint  Patient presents with  . Sore Throat    HPI Erik Sims is a 42 y.o. male.  The history is provided by the patient. No language interpreter was used.  Dental Pain   This is a new problem. The current episode started more than 1 week ago. The problem occurs constantly. The problem has been gradually worsening. The pain is moderate. He has tried nothing for the symptoms. The treatment provided no relief.  Pt complains of a broken tooth.  Pt reports he has a white area around tooth.  Pt reports he also has had a sore throat.   Past Medical History:  Diagnosis Date  . Back pain     There are no active problems to display for this patient.   History reviewed. No pertinent surgical history.     Home Medications    Prior to Admission medications   Medication Sig Start Date End Date Taking? Authorizing Provider  amoxicillin (AMOXIL) 500 MG capsule Take 1 capsule (500 mg total) by mouth 3 (three) times daily. 11/12/17   Elson AreasSofia, Robbie Nangle K, PA-C  diazepam (VALIUM) 5 MG tablet Take 1 tablet (5 mg total) by mouth every 12 (twelve) hours as needed for muscle spasms. Patient not taking: Reported on 06/09/2015 05/27/15   Muthersbaugh, Dahlia ClientHannah, PA-C  diclofenac (VOLTAREN) 75 MG EC tablet Take 1 tablet (75 mg total) by mouth 2 (two) times daily. 11/12/17   Elson AreasSofia, Vallie Fayette K, PA-C  HYDROcodone-acetaminophen (NORCO/VICODIN) 5-325 MG per tablet Take 1 tablet by mouth every 8 (eight) hours as needed for severe pain. 05/27/15   Muthersbaugh, Dahlia ClientHannah, PA-C  ibuprofen (ADVIL,MOTRIN) 600 MG tablet Take 1 tablet (600 mg total) by mouth every 6 (six) hours as needed. Patient not taking: Reported on 06/09/2015 05/27/15   Earley FavorSchulz, Gail, NP  Menthol, Topical Analgesic, (ICY HOT EX) Apply 1 application topically as needed (for back pain.).     [provider]  pseudoephedrine-guaifenesin (MUCINEX D) 60-600 MG 12 hr tablet Take 1 tablet by mouth every 12 (twelve) hours. 10/29/15   Joycie Peekartner, Benjamin, PA-C    Family History No family history on file.  Social History Social History   Tobacco Use  . Smoking status: Current Every Day Smoker    Packs/day: 0.50    Types: Cigarettes  . Smokeless tobacco: Never Used  Substance Use Topics  . Alcohol use: Yes    Alcohol/week: 4.2 oz    Types: 7 Cans of beer per week  . Drug use: Yes    Types: Marijuana     Allergies   Prednisone   Review of Systems Review of Systems  All other systems reviewed and are negative.    Physical Exam Updated Vital Signs BP 116/80 (BP Location: Right Arm)   Pulse 85   Temp 98.3 F (36.8 C) (Oral)   Resp 16   Ht 5\' 9"  (1.753 m)   Wt 59.9 kg (132 lb)   SpO2 100%   BMI 19.49 kg/m   Physical Exam  Constitutional: He appears well-developed and well-nourished.  HENT:  Head: Normocephalic and atraumatic.  Mouth/Throat: Oropharynx is clear and moist and mucous membranes are normal.  Broken tooth.  Erythema   Eyes: Pupils are equal, round, and reactive to light.  Neck: Normal range of motion.  Cardiovascular: Normal rate.  Pulmonary/Chest: Effort normal.  Abdominal: Soft.  Neurological: He is alert.  Skin: Skin is warm.  Psychiatric: He has a normal mood and affect.  Nursing note and vitals reviewed.    ED Treatments / Results  Labs (all labs ordered are listed, but only abnormal results are displayed) Labs Reviewed  RAPID STREP SCREEN (NOT AT [facility])  CULTURE, GROUP A STREP [facility])    EKG  EKG Interpretation None       Radiology No results found.  Procedures Procedures (including critical care time)  Medications Ordered in ED Medications - No data to display   Initial Impression / Assessment and Plan / ED Course  I have reviewed the triage vital signs and the nursing notes.  Pertinent labs & imaging  results that were available during my care of the patient were reviewed by me and considered in my medical decision making (see chart for details).       Final Clinical Impressions(s) / ED Diagnoses   Final diagnoses:  Pharyngitis, unspecified etiology  Toothache    ED Discharge Orders        Ordered    amoxicillin (AMOXIL) 500 MG capsule  3 times daily     11/12/17 0750    diclofenac (VOLTAREN) 75 MG EC tablet  2 times daily     11/12/17 0750    An After Visit Summary was printed and given to the patient.    Elson Areas, [place] 11/12/17 1333    Alvira Monday, MD 11/12/17 2238

## 2017-11-12 NOTE — Discharge Instructions (Addendum)
Return if any problems.

## 2017-11-14 LAB — CULTURE, GROUP A STREP (THRC)

## 2019-01-05 ENCOUNTER — Ambulatory Visit
Admission: EM | Admit: 2019-01-05 | Discharge: 2019-01-05 | Disposition: A | Discharge status: Home / Self Care | Payer: BLUE CROSS/BLUE SHIELD | Attending: Nurse Practitioner | Admitting: Nurse Practitioner

## 2019-01-05 DIAGNOSIS — M545 Low back pain, unspecified: Secondary | ICD-10-CM

## 2019-01-05 DIAGNOSIS — Z202 Contact with and (suspected) exposure to infections with a predominantly sexual mode of transmission: Secondary | ICD-10-CM | POA: Diagnosis present

## 2019-01-05 MED ORDER — METRONIDAZOLE 500 MG PO TABS: 2000.0000 mg | ORAL_TABLET | Freq: Once | ORAL | 0 refills | Status: AC

## 2019-01-05 MED ORDER — DICLOFENAC POTASSIUM 50 MG PO TABS: 50.0000 mg | ORAL_TABLET | Freq: Three times a day (TID) | ORAL | 0 refills | Status: DC | PRN

## 2019-01-05 MED ORDER — CYCLOBENZAPRINE HCL 10 MG PO TABS: 10.0000 mg | ORAL_TABLET | Freq: Two times a day (BID) | ORAL | 0 refills | Status: DC | PRN

## 2019-01-05 NOTE — Discharge Instructions (Addendum)
Take medications as prescribed  You will be notified only for positive results  You can go onto MyChart in a couple of days to review all of your test results  Avoid heavy lifting or pulling for at least 5 days  Alternate between ice and heat to affected areas  Do not drink alcohol for at least 24 hours after taking the metronidazole  Avoid intercourse for at least one week Drink plenty of water

## 2019-01-05 NOTE — ED Triage Notes (Signed)
Pt c/o lower back pain radiating down both legs x2 days; states old back injury

## 2019-01-05 NOTE — ED Provider Notes (Signed)
[name]    CSN: [id number] Arrival date & time: 01/05/19  1635     History   Chief Complaint Chief Complaint  Patient presents with  . Back Pain    HPI Erik Sims is a 43 y.o. male.   Subjective:  Erik Sims is a 43 y.o. male who presents for evaluation of low back pain. The patient has had recurrent self limited episodes of low back pain in the past (hx work related back injury in 2005). Symptoms have been present for 1 day and are unchanged.  Onset was related to / precipitated by no known injury. The pain is located in the across the lower back and does not radiate. The pain is described as aching and sharp and occurs intermittently. He rates his pain as a 6 on a scale of 0-10. Symptoms are exacerbated by sitting. Symptoms are improved by change in body position. He has also tried NSAIDs which provided minimal symptom relief. He denies any leg weakness, tingling in the legs, burning pain in the legs, urinary incontinence, urinary retention, bowel incontinence or groin/perineal numbness associated with the back pain. The patient has no "red flag" history indicative of complicated back pain.  Erik Sims also request treatment and evaluation for STD. He was notified by a sexual partner two days ago that she had trichomonas. He denies any other known STD exposure. He denies any symptoms at this time. Specifically, he denies any dysuria, penile pain or penile discharge.   The following portions of the patient's history were reviewed and updated as appropriate: allergies, current medications, past family history, past medical history, past social history, past surgical history and problem list.           Past Medical History:  Diagnosis Date  . Back pain     There are no active problems to display for this patient.   History reviewed. No pertinent surgical history.     Home Medications    Prior to Admission medications   Medication Sig  Start Date End Date Taking? Authorizing Provider  amoxicillin (AMOXIL) 500 MG capsule Take 1 capsule (500 mg total) by mouth 3 (three) times daily. 11/12/17   Elson Areas, PA-C  cyclobenzaprine (FLEXERIL) 10 MG tablet Take 1 tablet (10 mg total) by mouth 2 (two) times daily as needed for muscle spasms. 01/05/19   Erik Idol, FNP  diazepam (VALIUM) 5 MG tablet Take 1 tablet (5 mg total) by mouth every 12 (twelve) hours as needed for muscle spasms. Patient not taking: Reported on 06/09/2015 05/27/15   Muthersbaugh, Dahlia Client, PA-C  diclofenac (CATAFLAM) 50 MG tablet Take 1 tablet (50 mg total) by mouth 3 (three) times daily as needed (pain). 01/05/19   Erik Idol, FNP  ibuprofen (ADVIL,MOTRIN) 600 MG tablet Take 1 tablet (600 mg total) by mouth every 6 (six) hours as needed. Patient not taking: Reported on 06/09/2015 05/27/15   Earley Favor, NP  Menthol, Topical Analgesic, (ICY HOT EX) Apply 1 application topically as needed (for back pain.).    [provider]  metroNIDAZOLE (FLAGYL) 500 MG tablet Take 4 tablets (2,000 mg total) by mouth once for 1 dose. 01/05/19 01/05/19  Erik Idol, FNP    Family History No family history on file.  Social History Social History   Tobacco Use  . Smoking status: Current Every Day Smoker    Packs/day: 0.50    Types: Cigarettes  . Smokeless tobacco: Never Used  Substance Use Topics  . Alcohol use:  Yes    Alcohol/week: 7.0 standard drinks    Types: 7 Cans of beer per week  . Drug use: Yes    Types: Marijuana     Allergies   Prednisone   Review of Systems Review of Systems  Constitutional: Negative.   Genitourinary: Negative.   Musculoskeletal: Positive for back pain.  Neurological: Negative for numbness.  All other systems reviewed and are negative.    Physical Exam Triage Vital Signs ED Triage Vitals [01/05/19 1649]  Enc Vitals Group     BP 123/77     Pulse Rate 77     Resp 18     Temp 98.5 F (36.9 C)      Temp Source Oral     SpO2 98 %     Weight      Height      Head Circumference      Peak Flow      Pain Score 6     Pain Loc      Pain Edu?      Excl. in GC?    No data found.  Updated Vital Signs BP 123/77 (BP Location: Left Arm)   Pulse 77   Temp 98.5 F (36.9 C) (Oral)   Resp 18   SpO2 98%   Visual Acuity Right Eye Distance:   Left Eye Distance:   Bilateral Distance:    Right Eye Near:   Left Eye Near:    Bilateral Near:     Physical Exam Vitals signs reviewed.  Constitutional:      Appearance: Normal appearance.  HENT:     Head: Normocephalic.  Neck:     Musculoskeletal: Normal range of motion.  Cardiovascular:     Rate and Rhythm: Normal rate and regular rhythm.  Pulmonary:     Effort: Pulmonary effort is normal.     Breath sounds: Normal breath sounds.  Musculoskeletal: Normal range of motion.     Comments: Full range of motion without pain, no tenderness, no spasm, no curvature. Normal reflexes, gait, strength and negative straight-leg raise. Inspection of back is normal. Muscle tone normal without spasm. Straight leg raise negative    Lymphadenopathy:     Cervical: No cervical adenopathy.  Skin:    General: Skin is warm and dry.  Neurological:     General: No focal deficit present.     Mental Status: He is alert.     Cranial Nerves: Cranial nerves are intact.     Sensory: Sensation is intact. No sensory deficit.     Motor: Motor function is intact.     Coordination: Coordination is intact.     Gait: Gait is intact.  Psychiatric:        Mood and Affect: Mood normal.        Behavior: Behavior normal.        Thought Content: Thought content normal.        Judgment: Judgment normal.      UC Treatments / Results  Labs (all labs ordered are listed, but only abnormal results are displayed) Labs Reviewed  URINE CYTOLOGY ANCILLARY ONLY    EKG None  Radiology No results found.  Procedures Procedures (including critical care time)   Medications Ordered in UC Medications - No data to display  Initial Impression / Assessment and Plan / UC Course  I have reviewed the triage vital signs and the nursing notes.  Pertinent labs & imaging results that were available during my care of the patient  were reviewed by me and considered in my medical decision making (see chart for details).    Erik Sims is a 43 y.o. male who presents for evaluation of low back pain as well as treatment and evaluation for STD after known exposure to trichomonas.   Plan:   Natural history and expected course discussed. Questions answered. Proper lifting, bending technique discussed. Short (2-4 day) period of relative rest recommended until acute symptoms improve. Ice to affected area as needed for local pain relief. Heat to affected area as needed for local pain relief. NSAIDs per medication orders. Muscle relaxants per medication orders. Metronidazole 2000 mg po x 1  Do not drink alcohol for at least 24 hours after taking the metronidazole  Avoid intercourse for at least one week Drink plenty of water  Follow-up PRN   Today's evaluation has revealed no signs of a dangerous process. Discussed diagnosis with patient. Patient aware of their diagnosis, possible red flag symptoms to watch out for and need for close follow up. Patient understands verbal and written discharge instructions. Patient comfortable with plan and disposition.  Patient has a clear mental status at this time, good insight into illness (after discussion and teaching) and has clear judgment to make decisions regarding their care.  Documentation was completed with the aid of voice recognition software. Transcription may contain typographical errors.   Final Clinical Impressions(s) / UC Diagnoses   Final diagnoses:  Acute low back pain without sciatica, unspecified back pain laterality  Exposure to STD     Discharge Instructions     Take medications as prescribed   You will be notified only for positive results  You can go onto MyChart in a couple of days to review all of your test results  Avoid heavy lifting or pulling for at least 5 days  Alternate between ice and heat to affected areas  Do not drink alcohol for at least 24 hours after taking the metronidazole  Avoid intercourse for at least one week Drink plenty of water      ED Prescriptions    Medication Sig Dispense Auth. Provider   metroNIDAZOLE (FLAGYL) 500 MG tablet Take 4 tablets (2,000 mg total) by mouth once for 1 dose. 4 tablet Erik Idol, FNP   cyclobenzaprine (FLEXERIL) 10 MG tablet Take 1 tablet (10 mg total) by mouth 2 (two) times daily as needed for muscle spasms. 20 tablet Erik Idol, FNP   diclofenac (CATAFLAM) 50 MG tablet Take 1 tablet (50 mg total) by mouth 3 (three) times daily as needed (pain). 21 tablet Erik Idol, FNP     Controlled Substance Prescriptions [place] Controlled Substance Registry consulted? Not Applicable   Erik Idol, FNP 01/05/19 1725

## 2019-01-08 LAB — URINE CYTOLOGY ANCILLARY ONLY
CHLAMYDIA, DNA PROBE: NEGATIVE
Neisseria Gonorrhea: NEGATIVE
Trichomonas: POSITIVE — AB

## 2019-01-09 ENCOUNTER — Telehealth (HOSPITAL_COMMUNITY): Payer: Self-pay | Admitting: Emergency Medicine

## 2019-01-09 NOTE — Telephone Encounter (Signed)
Trichomonas is positive. Rx metronidazole was given at the urgent care visit. Pt needs education to please refrain from sexual intercourse for 7 days to give the medicine time to work. Sexual partners need to be notified and tested/treated. Condoms may reduce risk of reinfection. Recheck for further evaluation if symptoms are not improving.   Attempted to reach patient. No answer at this time. Voicemail left.    

## 2019-01-12 ENCOUNTER — Telehealth (HOSPITAL_COMMUNITY): Payer: Self-pay | Admitting: Emergency Medicine

## 2019-01-12 NOTE — Telephone Encounter (Signed)
Attempted to reach patient x2. No answer at this time. Voicemail left.    

## 2020-08-08 ENCOUNTER — Ambulatory Visit: Payer: Self-pay

## 2020-08-13 ENCOUNTER — Ambulatory Visit
Admission: RE | Admit: 2020-08-13 | Discharge: 2020-08-13 | Disposition: A | Discharge status: Home / Self Care | Payer: Self-pay | Source: Ambulatory Visit | Attending: Physician Assistant | Admitting: Physician Assistant

## 2020-08-13 ENCOUNTER — Other Ambulatory Visit: Payer: Self-pay

## 2020-08-13 VITALS — BP 127/82 | HR 78 | Temp 98.0°F | Resp 16

## 2020-08-13 DIAGNOSIS — M545 Low back pain, unspecified: Secondary | ICD-10-CM

## 2020-08-13 NOTE — Discharge Instructions (Signed)
Ibuprofen as needed for back pain in case it is muscle related. Cytology sent, you will be contacted with any positive results that requires further treatment. Refrain from sexual activity until testing results return. If developing testicular swelling/pain, penile lesion/sore, follow up for reevaluation.

## 2020-08-13 NOTE — ED Triage Notes (Signed)
PT states he has had lower back pain x 3 weeks and feels as though it is STD related even though he has chronic back issues as he states it feels different. Pt is aox4 and ambulatory.

## 2020-08-13 NOTE — ED Provider Notes (Signed)
[name]    CSN: [id number] Arrival date & time: 08/13/20  0845      History   Chief Complaint Chief Complaint  Patient presents with  . Back Pain    x 3 weeks  . SEXUALLY TRANSMITTED DISEASE    Possible exposure    HPI Erik Sims is a 44 y.o. male.   43 year old male comes in for 3 week history of low back pain. Pain is bilateral, constant, and radiates to the left leg at times. Back pain feels worse with stiffness after long hours of sitting/resting, and will feel improvement after some movement. Patient states has chronic intermittent back pain, and usually resolves on own after a few days. On instances where back pain has lasted this long, it has found to be STD related and therefore came in for testing. Denies urinary changes, hematuria. Denies penile discharge, penile lesion, testicular swelling, testicular pain. Sexually active with 2 male partners.      Past Medical History:  Diagnosis Date  . Back pain     There are no problems to display for this patient.   History reviewed. No pertinent surgical history.     Home Medications    Prior to Admission medications   Medication Sig Start Date End Date Taking? Authorizing Provider  cyclobenzaprine (FLEXERIL) 10 MG tablet Take 1 tablet (10 mg total) by mouth 2 (two) times daily as needed for muscle spasms. 01/05/19  Yes Lurline Idol, FNP  ibuprofen (ADVIL,MOTRIN) 600 MG tablet Take 1 tablet (600 mg total) by mouth every 6 (six) hours as needed. 05/27/15  Yes Earley Favor, NP  Menthol, Topical Analgesic, (ICY HOT EX) Apply 1 application topically as needed (for back pain.).   Yes [provider]  diazepam (VALIUM) 5 MG tablet Take 1 tablet (5 mg total) by mouth every 12 (twelve) hours as needed for muscle spasms. Patient not taking: Reported on 06/09/2015 05/27/15   Muthersbaugh, Boyd Kerbs    Family History History reviewed. No pertinent family history.  Social  History Social History   Tobacco Use  . Smoking status: Current Every Day Smoker    Packs/day: 0.50    Types: Cigarettes  . Smokeless tobacco: Never Used  Vaping Use  . Vaping Use: Never used  Substance Use Topics  . Alcohol use: Yes    Alcohol/week: 7.0 standard drinks    Types: 7 Cans of beer per week  . Drug use: Yes    Types: Marijuana     Allergies   Prednisone   Review of Systems Review of Systems  Reason unable to perform ROS: See HPI as above.     Physical Exam Triage Vital Signs ED Triage Vitals  Enc Vitals Group     BP 08/13/20 0911 127/82     Pulse Rate 08/13/20 0911 78     Resp 08/13/20 0911 16     Temp 08/13/20 0911 98 F (36.7 C)     Temp Source 08/13/20 0911 Oral     SpO2 08/13/20 0911 97 %     Weight --      Height --      Head Circumference --      Peak Flow --      Pain Score 08/13/20 0919 6     Pain Loc --      Pain Edu? --      Excl. in GC? --    No data found.  Updated Vital Signs BP 127/82 (BP Location:  Left Arm)   Pulse 78   Temp 98 F (36.7 C) (Oral)   Resp 16   SpO2 97%   Physical Exam Constitutional:      General: He is not in acute distress.    Appearance: He is well-developed. He is not diaphoretic.  HENT:     Head: Normocephalic and atraumatic.  Eyes:     Conjunctiva/sclera: Conjunctivae normal.     Pupils: Pupils are equal, round, and reactive to light.  Cardiovascular:     Rate and Rhythm: Normal rate and regular rhythm.     Heart sounds: Normal heart sounds. No murmur heard.  No friction rub. No gallop.   Pulmonary:     Effort: Pulmonary effort is normal. No accessory muscle usage or respiratory distress.     Breath sounds: Normal breath sounds. No stridor. No decreased breath sounds, wheezing, rhonchi or rales.  Musculoskeletal:     Comments: No tenderness on palpation of the back. Full ROM of back/hips. Strength/sensation intact. Negative straight leg raise.  Skin:    General: Skin is warm and dry.   Neurological:     Mental Status: He is alert and oriented to person, place, and time.      UC Treatments / Results  Labs (all labs ordered are listed, but only abnormal results are displayed) Labs Reviewed  CYTOLOGY, (ORAL, ANAL, URETHRAL) ANCILLARY ONLY    EKG   Radiology No results found.  Procedures Procedures (including critical care time)  Medications Ordered in UC Medications - No data to display  Initial Impression / Assessment and Plan / UC Course  I have reviewed the triage vital signs and the nursing notes.  Pertinent labs & imaging results that were available during my care of the patient were reviewed by me and considered in my medical decision making (see chart for details).    Back pain is not reproducible on exam, but does seem to be associated with activity. NSAIDs if needed. Cytology sent, patient will be contacted with any positive results that require additional treatment. Patient to refrain from sexual activity until testing results return. Return precautions given.   Final Clinical Impressions(s) / UC Diagnoses   Final diagnoses:  Acute bilateral low back pain without sciatica   ED Prescriptions    None     PDMP not reviewed this encounter.   Belinda Fisher, PA-C 08/13/20 [phone]

## 2020-08-16 LAB — CYTOLOGY, (ORAL, ANAL, URETHRAL) ANCILLARY ONLY
Chlamydia: NEGATIVE
Comment: NEGATIVE
Comment: NEGATIVE
Comment: NORMAL
Neisseria Gonorrhea: NEGATIVE
Trichomonas: NEGATIVE

## 2024-04-27 ENCOUNTER — Encounter (HOSPITAL_COMMUNITY): Payer: Self-pay | Admitting: Emergency Medicine

## 2024-04-27 ENCOUNTER — Emergency Department (HOSPITAL_COMMUNITY)
Admission: EM | Admit: 2024-04-27 | Discharge: 2024-04-27 | Disposition: A | Discharge status: Home / Self Care | Payer: Self-pay | Attending: Emergency Medicine | Admitting: Emergency Medicine

## 2024-04-27 ENCOUNTER — Emergency Department (HOSPITAL_COMMUNITY): Payer: Self-pay

## 2024-04-27 ENCOUNTER — Other Ambulatory Visit: Payer: Self-pay

## 2024-04-27 DIAGNOSIS — M79631 Pain in right forearm: Secondary | ICD-10-CM | POA: Insufficient documentation

## 2024-04-27 DIAGNOSIS — M79601 Pain in right arm: Secondary | ICD-10-CM

## 2024-04-27 DIAGNOSIS — W19XXXA Unspecified fall, initial encounter: Secondary | ICD-10-CM | POA: Insufficient documentation

## 2024-04-27 DIAGNOSIS — S0081XA Abrasion of other part of head, initial encounter: Secondary | ICD-10-CM | POA: Insufficient documentation

## 2024-04-27 DIAGNOSIS — M79632 Pain in left forearm: Secondary | ICD-10-CM | POA: Insufficient documentation

## 2024-04-27 LAB — I-STAT CHEM 8, ED
BUN: 9 mg/dL (ref 6–20)
Calcium, Ion: 1.14 mmol/L — ABNORMAL LOW (ref 1.15–1.40)
Chloride: 106 mmol/L (ref 98–111)
Creatinine, Ser: 1 mg/dL (ref 0.61–1.24)
Glucose, Bld: 92 mg/dL (ref 70–99)
HCT: 43 % (ref 39.0–52.0)
Hemoglobin: 14.6 g/dL (ref 13.0–17.0)
Potassium: 3.9 mmol/L (ref 3.5–5.1)
Sodium: 141 mmol/L (ref 135–145)
TCO2: 21 mmol/L — ABNORMAL LOW (ref 22–32)

## 2024-04-27 LAB — CK: Total CK: 195 U/L (ref 49–397)

## 2024-04-27 MED ORDER — OXYCODONE-ACETAMINOPHEN 5-325 MG PO TABS: 1.0000 | ORAL_TABLET | Freq: Four times a day (QID) | ORAL | 0 refills | Status: DC | PRN

## 2024-04-27 MED ORDER — OXYCODONE-ACETAMINOPHEN 5-325 MG PO TABS
2.0000 | ORAL_TABLET | Freq: Once | ORAL | Status: AC
  Administered 2024-04-27: 2 via ORAL
  Filled 2024-04-27: qty 2

## 2024-04-27 MED ORDER — METHOCARBAMOL 500 MG PO TABS: 500.0000 mg | ORAL_TABLET | Freq: Two times a day (BID) | ORAL | 0 refills | Status: DC

## 2024-04-27 NOTE — ED Triage Notes (Signed)
 Pt arrived via POV c/o new onset bilateral arm pain around the elbow area. Pain started last night. Pt reports taking hydrocodone  with no relief.

## 2024-04-27 NOTE — ED Provider Notes (Signed)
 [place] EMERGENCY DEPARTMENT AT [facility] Provider Note   CSN: [id number] Arrival date & time: 04/27/24  9347     Patient presents with: Arm Pain   Erik Sims is a 48 y.o. male.   48 year old male presents with bilateral forearm pain.  States that he fell yesterday while he was drinking alcohol.  Has pain to the mid portions of both forearms.  Denies any distal numbness or tingling to both of his hands.  Does not take any blood thinners.  Did have an abrasion noted to the right side of his head.  According to significant other, patient has not had any emesis or status changes.  Denies any neck pain.  Denies any abdominal or chest discomfort.  No treatment use prior to arrival       Prior to Admission medications   Medication Sig Start Date End Date Taking? Authorizing Provider  cyclobenzaprine  (FLEXERIL ) 10 MG tablet Take 1 tablet (10 mg total) by mouth 2 (two) times daily as needed for muscle spasms. 01/05/19   Iola Lukes, FNP  diazepam  (VALIUM ) 5 MG tablet Take 1 tablet (5 mg total) by mouth every 12 (twelve) hours as needed for muscle spasms. Patient not taking: Reported on 06/09/2015 05/27/15   Muthersbaugh, Chiquita, PA-C  ibuprofen  (ADVIL ,MOTRIN ) 600 MG tablet Take 1 tablet (600 mg total) by mouth every 6 (six) hours as needed. 05/27/15   Terryl Kubas, NP  Menthol, Topical Analgesic, (ICY HOT EX) Apply 1 application topically as needed (for back pain.).    [provider]    Allergies: Prednisone    Review of Systems  All other systems reviewed and are negative.   Updated Vital Signs BP 123/71 (BP Location: Left Arm)   Pulse (!) 103   Temp 98.1 F (36.7 C)   Resp 16   Ht 1.753 m (5' 9)   Wt 63.5 kg   SpO2 95%   BMI 20.67 kg/m   Physical Exam Vitals and nursing note reviewed.  Constitutional:      General: He is not in acute distress.    Appearance: Normal appearance. He is well-developed. He is not toxic-appearing.  HENT:      Head:   Eyes:     General: Lids are normal.     Conjunctiva/sclera: Conjunctivae normal.     Pupils: Pupils are equal, round, and reactive to light.  Neck:     Thyroid: No thyroid mass.     Trachea: No tracheal deviation.  Cardiovascular:     Rate and Rhythm: Normal rate and regular rhythm.     Heart sounds: Normal heart sounds. No murmur heard.    No gallop.  Pulmonary:     Effort: Pulmonary effort is normal. No respiratory distress.     Breath sounds: Normal breath sounds. No stridor. No decreased breath sounds, wheezing, rhonchi or rales.  Abdominal:     General: There is no distension.     Palpations: Abdomen is soft.     Tenderness: There is no abdominal tenderness. There is no rebound.  Musculoskeletal:        General: No tenderness. Normal range of motion.     Cervical back: Normal range of motion and neck supple.     Comments: Bilateral forearm compartments are soft.  Neurovasc intact at both hands.  Full range of motion at the elbows without signs of injury or effusion.  Skin:    General: Skin is warm and dry.     Findings: No  abrasion or rash.  Neurological:     General: No focal deficit present.     Mental Status: He is alert and oriented to person, place, and time. Mental status is at baseline.     GCS: GCS eye subscore is 4. GCS verbal subscore is 5. GCS motor subscore is 6.     Cranial Nerves: No cranial nerve deficit.     Sensory: No sensory deficit.     Motor: Motor function is intact.  Psychiatric:        Attention and Perception: Attention normal.        Speech: Speech normal.        Behavior: Behavior normal.     (all labs ordered are listed, but only abnormal results are displayed) Labs Reviewed  CK  I-STAT CHEM 8, ED    EKG: None  Radiology: No results found.   Procedures   Medications Ordered in the ED  oxyCODONE -acetaminophen  (PERCOCET/ROXICET) 5-325 MG per tablet 2 tablet (has no administration in time range)                                     Medical Decision Making Amount and/or Complexity of Data Reviewed Labs: ordered. Radiology: ordered.  Risk Prescription drug management.   Patient here with bilateral arm pain after fall while drinking alcohol.  Concern for possible rhabdomyolysis and CK was normal.  Renal function normal as well 2.  X-rays of bilateral forearms were normal.  Compartments remain soft on reexam.  Given Percocet and feels better at this time.  Patient does have an abrasion on the right side of his head but according to his significant other head he has been stable and has not had any symptoms or neurological impairment.  Will therefore not get head CT at this time.  Will discharge home on short course of opiates as well as muscle relaxants.     Final diagnoses:  None    ED Discharge Orders     None          Dasie Faden, MD 04/27/24 [phone]

## 2024-04-30 ENCOUNTER — Telehealth: Payer: Self-pay | Admitting: Physician Assistant

## 2024-04-30 DIAGNOSIS — M79632 Pain in left forearm: Secondary | ICD-10-CM

## 2024-04-30 DIAGNOSIS — M79631 Pain in right forearm: Secondary | ICD-10-CM

## 2024-04-30 DIAGNOSIS — R899 Unspecified abnormal finding in specimens from other organs, systems and tissues: Secondary | ICD-10-CM

## 2024-04-30 MED ORDER — MELOXICAM 15 MG PO TABS: 15.0000 mg | ORAL_TABLET | Freq: Every day | ORAL | 0 refills | Status: DC

## 2024-04-30 NOTE — Patient Instructions (Signed)
  Erik Sims, thank you for joining [name], PA-C for today's virtual visit.  While this provider is not your primary care provider (PCP), if your PCP is located in our provider database this encounter information will be shared with them immediately following your visit.   A [facility] MyChart account gives you access to today's visit and all your visits, tests, and labs performed at [facility]  click here if you don't have a [facility] MyChart account or go to [email or web address]  Consent: (Patient) Erik Sims provided verbal consent for this virtual visit at the beginning of the encounter.  Current Medications:  Current Outpatient Medications:    meloxicam  (MOBIC ) 15 MG tablet, Take 1 tablet (15 mg total) by mouth daily., Disp: 30 tablet, Rfl: 0   cyclobenzaprine  (FLEXERIL ) 10 MG tablet, Take 1 tablet (10 mg total) by mouth 2 (two) times daily as needed for muscle spasms., Disp: 20 tablet, Rfl: 0   diazepam  (VALIUM ) 5 MG tablet, Take 1 tablet (5 mg total) by mouth every 12 (twelve) hours as needed for muscle spasms. (Patient not taking: Reported on 06/09/2015), Disp: 10 tablet, Rfl: 0   ibuprofen  (ADVIL ,MOTRIN ) 600 MG tablet, Take 1 tablet (600 mg total) by mouth every 6 (six) hours as needed., Disp: 30 tablet, Rfl: 0   Menthol, Topical Analgesic, (ICY HOT EX), Apply 1 application topically as needed (for back pain.)., Disp: , Rfl:    methocarbamol  (ROBAXIN ) 500 MG tablet, Take 1 tablet (500 mg total) by mouth 2 (two) times daily., Disp: 20 tablet, Rfl: 0   oxyCODONE -acetaminophen  (PERCOCET/ROXICET) 5-325 MG tablet, Take 1 tablet by mouth every 6 (six) hours as needed for severe pain (pain score 7-10)., Disp: 10 tablet, Rfl: 0   Medications ordered in this encounter:  Meds ordered this encounter  Medications   meloxicam  (MOBIC ) 15 MG tablet    Sig: Take 1 tablet (15 mg total) by mouth daily.    Dispense:  30 tablet    Refill:  0    Supervising  Provider:   LAMPTEY, PHILIP O [[id number]]     *If you need refills on other medications prior to your next appointment, please contact your pharmacy*  Follow-Up: Call back or seek an in-person evaluation if the symptoms worsen or if the condition fails to improve as anticipated.  [facility] [phone]  Other Instructions Please hydrate and rest. Use link below to get set up with a new primary care provider for ongoing care.  I have sent in a course of Mobic  to take once daily once you are finished with your other pain medication, to further help reduce arm pain and discomfort until resolved. If not resolving over the next week, please seek an in-person evaluation.   If you have been instructed to have an in-person evaluation today at a local Urgent Care facility, please use the link below. It will take you to a list of all of our available [place] [facility], including address, phone number and hours of operation. Please do not delay care.  [facility]  If you or a family member do not have a primary care provider, use the link below to schedule a visit and establish care. When you choose a [facility] primary care physician or advanced practice provider, you gain a long-term partner in health. Find a Primary Care Provider  Learn more about [facility]'s in-office and virtual care options: [place] - Get Care Now

## 2024-04-30 NOTE — Progress Notes (Signed)
 Virtual Visit Consent   Page Lancon, you are scheduled for a virtual visit with a [facility] provider today. Just as with appointments in the office, your consent must be obtained to participate. Your consent will be active for this visit and any virtual visit you may have with one of our providers in the next 365 days. If you have a MyChart account, a copy of this consent can be sent to you electronically.  As this is a virtual visit, video technology does not allow for your provider to perform a traditional examination. This may limit your provider's ability to fully assess your condition. If your provider identifies any concerns that need to be evaluated in person or the need to arrange testing (such as labs, EKG, etc.), we will make arrangements to do so. Although advances in technology are sophisticated, we cannot ensure that it will always work on either your end or our end. If the connection with a video visit is poor, the visit may have to be switched to a telephone visit. With either a video or telephone visit, we are not always able to ensure that we have a secure connection.  By engaging in this virtual visit, you consent to the provision of healthcare and authorize for your insurance to be billed (if applicable) for the services provided during this visit. Depending on your insurance coverage, you may receive a charge related to this service.  I need to obtain your verbal consent now. Are you willing to proceed with your visit today? Erik Sims has provided verbal consent on 04/30/2024 for a virtual visit (video or telephone). Erik Sims, [place]  Date: 04/30/2024 8:56 AM   Virtual Visit via Video Note   I, Erik Sims, connected with  Erik Sims  ([id number], 1975/11/24) on 04/30/24 at  8:15 AM EDT by a video-enabled telemedicine application and verified that I am speaking with the correct person using two identifiers.  Location: Patient: Virtual Visit  Location Patient: Home Provider: Virtual Visit Location Provider: Home Office   I discussed the limitations of evaluation and management by telemedicine and the availability of in person appointments. The patient expressed understanding and agreed to proceed.    History of Present Illness: Erik Sims is a 48 y.o. who identifies as a male who was assigned male at birth, and is being seen today for discussion of test results from recent ER visit.  Was evaluated in ER on 7/14 for bilateral arm pain after a fall while intoxicated. ER workup including plain films of bilateral forearms (unremarkable), CK (normal) and a Chem panel (normal except for Cal Ion at 1.14 and Co2 at 21). Notes that he noted some abnormal lab results and wanted to know if he needed to do anything. In regards to arm pain, notes still hurting but improved. Is about out of his pain medication. Denies any new or worsening symptoms.   HPI: HPI  Problems: There are no active problems to display for this patient.   Allergies:  Allergies  Allergen Reactions   Prednisone Itching    States Internal itching   Medications:  Current Outpatient Medications:    meloxicam  (MOBIC ) 15 MG tablet, Take 1 tablet (15 mg total) by mouth daily., Disp: 30 tablet, Rfl: 0   cyclobenzaprine  (FLEXERIL ) 10 MG tablet, Take 1 tablet (10 mg total) by mouth 2 (two) times daily as needed for muscle spasms., Disp: 20 tablet, Rfl: 0   diazepam  (VALIUM ) 5 MG tablet, Take 1 tablet (5 mg  total) by mouth every 12 (twelve) hours as needed for muscle spasms. (Patient not taking: Reported on 06/09/2015), Disp: 10 tablet, Rfl: 0   ibuprofen  (ADVIL ,MOTRIN ) 600 MG tablet, Take 1 tablet (600 mg total) by mouth every 6 (six) hours as needed., Disp: 30 tablet, Rfl: 0   Menthol, Topical Analgesic, (ICY HOT EX), Apply 1 application topically as needed (for back pain.)., Disp: , Rfl:    methocarbamol  (ROBAXIN ) 500 MG tablet, Take 1 tablet (500 mg total) by mouth 2 (two)  times daily., Disp: 20 tablet, Rfl: 0   oxyCODONE -acetaminophen  (PERCOCET/ROXICET) 5-325 MG tablet, Take 1 tablet by mouth every 6 (six) hours as needed for severe pain (pain score 7-10)., Disp: 10 tablet, Rfl: 0  Observations/Objective: Patient is well-developed, well-nourished in no acute distress.  Resting comfortably at home.  Head is normocephalic, atraumatic.  No labored breathing. Speech is clear and coherent with logical content.  Patient is alert and oriented at baseline.   Assessment and Plan: 1. Abnormal laboratory test result (Primary)  Ion,Calcium at 1.14 (0.01 below normal threshold), and TCo2 at 21 (1 below normal threshold). Discussed these are most likely incidental findings and affected by hydration/alcohol use prior to ER visit. Recommend good hydration. Follow-up with a PCP for recheck just to ensure normal repeat values.  2. Pain in both forearms - meloxicam  (MOBIC ) 15 MG tablet; Take 1 tablet (15 mg total) by mouth daily.  Dispense: 30 tablet; Refill: 0  Improving. Will add on Mobic  to take the place of his narcotic pain medication as symptoms are improved. Follow-up in person for any non-resolving, new or worsening symptoms.  Follow Up Instructions: I discussed the assessment and treatment plan with the patient. The patient was provided an opportunity to ask questions and all were answered. The patient agreed with the plan and demonstrated an understanding of the instructions.  A copy of instructions were sent to the patient via MyChart unless otherwise noted below.   The patient was advised to call back or seek an in-person evaluation if the symptoms worsen or if the condition fails to improve as anticipated.    Erik Velma Lunger, PA-C

## 2024-06-01 ENCOUNTER — Encounter: Payer: Self-pay | Admitting: Family Medicine

## 2024-06-01 ENCOUNTER — Ambulatory Visit (INDEPENDENT_AMBULATORY_CARE_PROVIDER_SITE_OTHER): Payer: Self-pay | Admitting: Family Medicine

## 2024-06-01 ENCOUNTER — Ambulatory Visit: Payer: Self-pay

## 2024-06-01 VITALS — BP 130/74 | HR 85 | Temp 98.2°F | Ht 69.0 in | Wt 143.0 lb

## 2024-06-01 DIAGNOSIS — M25521 Pain in right elbow: Secondary | ICD-10-CM

## 2024-06-01 DIAGNOSIS — Z13220 Encounter for screening for lipoid disorders: Secondary | ICD-10-CM

## 2024-06-01 DIAGNOSIS — M542 Cervicalgia: Secondary | ICD-10-CM

## 2024-06-01 DIAGNOSIS — M79601 Pain in right arm: Secondary | ICD-10-CM

## 2024-06-01 DIAGNOSIS — Z7689 Persons encountering health services in other specified circumstances: Secondary | ICD-10-CM

## 2024-06-01 DIAGNOSIS — Z125 Encounter for screening for malignant neoplasm of prostate: Secondary | ICD-10-CM

## 2024-06-01 DIAGNOSIS — Z1211 Encounter for screening for malignant neoplasm of colon: Secondary | ICD-10-CM

## 2024-06-01 DIAGNOSIS — Z6821 Body mass index (BMI) 21.0-21.9, adult: Secondary | ICD-10-CM

## 2024-06-01 NOTE — Patient Instructions (Addendum)
-  It was nice to meet you and look forward to taking care of you. -Placed a referral to GI for colonoscopy. Please call the office or send a MyChart message if you do not receive a phone call or a MyChart message about appointment in 2 weeks.  -Ordered labs (CBC, CMP, TSH, PSA, and Lipid panel) based on BMI and screening. Recommend to make a lab appointment when fasting. Office will call with results and will be available through MyChart. -Ordered right elbow x-ray for pain.  -Ordered cervical x-ray for pain and placed a referral to orthopedic. Please call the office or send a MyChart message if you do not receive a phone call or a MyChart message about appointment in 2 weeks.  -Alternate Tylenol  1000mg  and Ibuprofen  600-800mg  every 4 hours for pain. Recommend to eat something when taking Ibuprofen .   -May continue to use warm or cool compresses, 4-6 times a day up to 20 minutes at a time.  -Follow up in 1 year for a physical.

## 2024-06-01 NOTE — Progress Notes (Signed)
 New Patient Office Visit  Subjective   Patient ID: Erik Sims, male    DOB: 1976/04/17  Age: 48 y.o. MRN: [id number]  CC:  Chief Complaint  Patient presents with   Establish Care   Arm Pain    HPI Erik Sims presents to establish care with new provider.  Patients previous primary care provider: None   Specialist: None   Patient is having right arm pain. Intermittent pain. Pain is located in distal upper arm, elbow, and forearm. Pain described as pressure and itchy. He reports he keeps it wrapped and ice compresses. The pain started after an injury. He fell while drinking alcohol. He was seen on 07/14 at [facility] ED for bilateral forearm pain. He had a left and right forearm x-ray that showed no fracture or acute bony findings. No elbow joint effusion. He reports the pain is not getting any better. Denies any swelling.   He reports he is having some numbness/tingling and when turning his head, he can feel a pain from his neck to lumbar region. Numbness and tingling from left thoracic to feet. 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 On 02/09/2004, he had a lumbar spine x-ray that was normal. On 03/14/2004, he had an MRI lumbar spine with contrast with the following: Large disc extrusion, L5-S1 central and to the left with significant compression of the thecal sac as well as the left S1 nerve root. Small subligamentous protrusion, L4-5 without neural encroachment.  Lower lumbar facet arthropathy, worst at L4-5. Then, in 02/17/2009 he had a cervical spine x-ray with mild C6-C7 degenerative disease. No acute osseous abnormality.   Outpatient Encounter Medications as of 06/01/2024  Medication Sig   [DISCONTINUED] cyclobenzaprine  (FLEXERIL ) 10 MG tablet Take 1 tablet (10 mg total) by mouth 2 (two) times daily as needed for muscle spasms.   [DISCONTINUED] diazepam  (VALIUM ) 5 MG tablet Take 1 tablet (5 mg total) by mouth every 12 (twelve) hours as  needed for muscle spasms. (Patient not taking: Reported on 06/09/2015)   [DISCONTINUED] ibuprofen  (ADVIL ,MOTRIN ) 600 MG tablet Take 1 tablet (600 mg total) by mouth every 6 (six) hours as needed.   [DISCONTINUED] meloxicam  (MOBIC ) 15 MG tablet Take 1 tablet (15 mg total) by mouth daily.   [DISCONTINUED] Menthol, Topical Analgesic, (ICY HOT EX) Apply 1 application topically as needed (for back pain.).   [DISCONTINUED] methocarbamol  (ROBAXIN ) 500 MG tablet Take 1 tablet (500 mg total) by mouth 2 (two) times daily.   [DISCONTINUED] oxyCODONE -acetaminophen  (PERCOCET/ROXICET) 5-325 MG tablet Take 1 tablet by mouth every 6 (six) hours as needed for severe pain (pain score 7-10).   No facility-administered encounter medications on file as of 06/01/2024.    Past Medical History:  Diagnosis Date   Back pain     History reviewed. No pertinent surgical history.  Family History  Problem Relation Age of Onset   Cancer Mother        Breast   Cancer Father        Prostate   Psychiatric Illness Son    Cancer Maternal Grandmother    Cancer Maternal Grandfather     Social History   Socioeconomic History   Marital status: Single    Spouse name: Not on file   Number of children: 1   Years of education: Not on file   Highest education level: GED or equivalent  Occupational History   Not on file  Tobacco Use   Smoking status: Every  Day    Current packs/day: 0.50    Types: Cigars, Cigarettes   Smokeless tobacco: Never  Vaping Use   Vaping status: Never Used  Substance and Sexual Activity   Alcohol use: Not Currently    Alcohol/week: 7.0 standard drinks of alcohol    Types: 7 Cans of beer per week    Comment: Quit in July   Drug use: Yes    Types: Marijuana    Comment: Twice a month   Sexual activity: Not on file  Other Topics Concern   Not on file  Social History Narrative   Not on file   Social Drivers of Health   Financial Resource Strain: Low Risk  (06/01/2024)   Overall  Financial Resource Strain (CARDIA)    Difficulty of Paying Living Expenses: Not hard at all  Food Insecurity: No Food Insecurity (06/01/2024)   Hunger Vital Sign    Worried About Running Out of Food in the Last Year: Never true    Ran Out of Food in the Last Year: Never true  Transportation Needs: No Transportation Needs (06/01/2024)   PRAPARE - Administrator, Civil Service (Medical): No    Lack of Transportation (Non-Medical): No  Physical Activity: Insufficiently Active (06/01/2024)   Exercise Vital Sign    Days of Exercise per Week: 4 days    Minutes of Exercise per Session: 30 min  Stress: No Stress Concern Present (06/01/2024)   [employer] of Occupational Health - Occupational Stress Questionnaire    Feeling of Stress: Not at all  Social Connections: Moderately Integrated (06/01/2024)   Social Connection and Isolation Panel    Frequency of Communication with Friends and Family: More than three times a week    Frequency of Social Gatherings with Friends and Family: Once a week    Attends Religious Services: 1 to 4 times per year    Active Member of [employer] or Organizations: No    Attends Banker Meetings: Never    Marital Status: Married  Catering manager Violence: Not At Risk (06/01/2024)   Humiliation, Afraid, Rape, and Kick questionnaire    Fear of Current or Ex-Partner: No    Emotionally Abused: No    Physically Abused: No    Sexually Abused: No    ROS See HPI above    Objective  BP 130/74   Pulse 85   Temp 98.2 F (36.8 C) (Oral)   Ht 5' 9 (1.753 m)   Wt 143 lb (64.9 kg)   SpO2 96%   BMI 21.12 kg/m   Physical Exam Vitals reviewed.  Constitutional:      General: He is not in acute distress.    Appearance: Normal appearance. He is normal weight. He is not ill-appearing, toxic-appearing or diaphoretic.  HENT:     Head: Normocephalic and atraumatic.  Eyes:     General:        Right eye: No discharge.        Left eye: No discharge.      Conjunctiva/sclera: Conjunctivae normal.  Cardiovascular:     Rate and Rhythm: Normal rate and regular rhythm.     Pulses:          Radial pulses are 2+ on the right side and 2+ on the left side.     Heart sounds: Normal heart sounds. No murmur heard.    No friction rub. No gallop.  Pulmonary:     Effort: Pulmonary effort is normal. No respiratory distress.  Breath sounds: Normal breath sounds.  Musculoskeletal:        General: Normal range of motion.     Right elbow: No swelling or deformity. Normal range of motion. Tenderness (During flexion) present.     Cervical back: No swelling, deformity or tenderness. Pain with movement present. Normal range of motion.  Skin:    General: Skin is warm and dry.  Neurological:     General: No focal deficit present.     Mental Status: He is alert and oriented to person, place, and time. Mental status is at baseline.     Motor: No weakness.     Gait: Gait normal.  Psychiatric:        Mood and Affect: Mood normal.        Behavior: Behavior normal.        Thought Content: Thought content normal.        Judgment: Judgment normal.      Assessment & Plan:  Colon cancer screening -     Ambulatory referral to Gastroenterology  Cervical pain -     DG Cervical Spine Complete; Future -     Ambulatory referral to Orthopedic Surgery  Right arm pain -     DG Elbow 2 Views Right; Future  Encounter to establish care -     CBC with Differential/Platelet; Future -     Comprehensive metabolic panel with GFR; Future -     TSH; Future  Prostate cancer screening -     PSA; Future  Need for lipid screening -     Lipid panel; Future  BMI 21.0-21.9, adult -     CBC with Differential/Platelet; Future -     Comprehensive metabolic panel with GFR; Future -     Lipid panel; Future -     PSA; Future -     TSH; Future   1.Review health maintenance:  -Colonscopy: Placed a referral to GI  -Covid vaccine: Declines -Hep B vaccine: Unsure -Tdap  vaccine: Unsure -HIV and Hep C screening: Previously  -Influenza vaccine: Declines  -PNA and Zoster vaccine: Declines  2.Ordered labs (CBC, CMP, TSH, PSA, and Lipid panel) based on BMI and screening. Recommend to make a lab appointment when fasting. Office will call with results and will be available through MyChart. 3.Ordered right elbow x-ray for pain.  4.Ordered cervical x-ray for pain and placed a referral to orthopedic. Please call the office or send a MyChart message if you do not receive a phone call or a MyChart message about appointment in 2 weeks.  5.Alternate Tylenol  1000mg  and Ibuprofen  600-800mg  every 4 hours for pain. Recommend to eat something when taking Ibuprofen .   6.May continue to use warm or cool compresses, 4-6 times a day up to 20 minutes at a time.  Return in about 1 year (around 06/01/2025) for physical; lab visit when fasting .   Rylin Seavey, NP

## 2024-06-08 ENCOUNTER — Other Ambulatory Visit: Payer: Self-pay

## 2024-06-08 ENCOUNTER — Ambulatory Visit: Payer: Self-pay | Admitting: Family Medicine

## 2024-06-09 ENCOUNTER — Ambulatory Visit: Payer: Self-pay | Admitting: Family Medicine

## 2024-06-09 DIAGNOSIS — D649 Anemia, unspecified: Secondary | ICD-10-CM

## 2024-06-09 DIAGNOSIS — M4802 Spinal stenosis, cervical region: Secondary | ICD-10-CM

## 2024-06-12 ENCOUNTER — Other Ambulatory Visit: Payer: Self-pay

## 2024-06-12 DIAGNOSIS — Z6821 Body mass index (BMI) 21.0-21.9, adult: Secondary | ICD-10-CM

## 2024-06-12 DIAGNOSIS — Z13220 Encounter for screening for lipoid disorders: Secondary | ICD-10-CM

## 2024-06-12 DIAGNOSIS — Z125 Encounter for screening for malignant neoplasm of prostate: Secondary | ICD-10-CM

## 2024-06-12 DIAGNOSIS — Z7689 Persons encountering health services in other specified circumstances: Secondary | ICD-10-CM

## 2024-06-12 LAB — LIPID PANEL
Cholesterol: 184 mg/dL (ref 0–200)
HDL: 38.9 mg/dL — ABNORMAL LOW (ref >39.00)
LDL Cholesterol: 135 mg/dL — ABNORMAL HIGH (ref 0–99)
NonHDL: 145.2
Total CHOL/HDL Ratio: 5
Triglycerides: 50 mg/dL (ref 0.0–149.0)
VLDL: 10 mg/dL (ref 0.0–40.0)

## 2024-06-12 LAB — CBC WITH DIFFERENTIAL/PLATELET
Basophils Absolute: 0 K/uL (ref 0.0–0.1)
Basophils Relative: 0.7 % (ref 0.0–3.0)
Eosinophils Absolute: 0.5 K/uL (ref 0.0–0.7)
Eosinophils Relative: 7.2 % — ABNORMAL HIGH (ref 0.0–5.0)
HCT: 39.7 % (ref 39.0–52.0)
Hemoglobin: 12.4 g/dL — ABNORMAL LOW (ref 13.0–17.0)
Lymphocytes Relative: 43.4 % (ref 12.0–46.0)
Lymphs Abs: 3.1 K/uL (ref 0.7–4.0)
MCHC: 31.2 g/dL (ref 30.0–36.0)
MCV: 73.9 fl — ABNORMAL LOW (ref 78.0–100.0)
Monocytes Absolute: 0.5 K/uL (ref 0.1–1.0)
Monocytes Relative: 7.3 % (ref 3.0–12.0)
Neutro Abs: 3 K/uL (ref 1.4–7.7)
Neutrophils Relative %: 41.4 % — ABNORMAL LOW (ref 43.0–77.0)
Platelets: 302 K/uL (ref 150.0–400.0)
RBC: 5.38 Mil/uL (ref 4.22–5.81)
RDW: 15.5 % (ref 11.5–15.5)
WBC: 7.1 K/uL (ref 4.0–10.5)

## 2024-06-12 LAB — COMPREHENSIVE METABOLIC PANEL WITH GFR
ALT: 13 U/L (ref 0–53)
AST: 17 U/L (ref 0–37)
Albumin: 4.2 g/dL (ref 3.5–5.2)
Alkaline Phosphatase: 90 U/L (ref 39–117)
BUN: 10 mg/dL (ref 6–23)
CO2: 28 meq/L (ref 19–32)
Calcium: 9.1 mg/dL (ref 8.4–10.5)
Chloride: 106 meq/L (ref 96–112)
Creatinine, Ser: 1.06 mg/dL (ref 0.40–1.50)
GFR: 81.94 mL/min (ref >60.00)
Glucose, Bld: 91 mg/dL (ref 70–99)
Potassium: 4.3 meq/L (ref 3.5–5.1)
Sodium: 141 meq/L (ref 135–145)
Total Bilirubin: 0.6 mg/dL (ref 0.2–1.2)
Total Protein: 6.5 g/dL (ref 6.0–8.3)

## 2024-06-12 LAB — PSA: PSA: 0.79 ng/mL (ref 0.10–4.00)

## 2024-06-12 LAB — TSH: TSH: 1.58 u[IU]/mL (ref 0.35–5.50)

## 2024-06-12 NOTE — Addendum Note (Signed)
 Addended by: ELNER NANNY B on: 06/12/2024 04:54 PM   Modules accepted: Orders

## 2024-06-16 ENCOUNTER — Ambulatory Visit: Payer: Self-pay

## 2024-06-16 DIAGNOSIS — D649 Anemia, unspecified: Secondary | ICD-10-CM

## 2024-06-17 LAB — IRON,TIBC AND FERRITIN PANEL
%SAT: 29 % (ref 20–48)
Ferritin: 242 ng/mL (ref 38–380)
Iron: 84 ug/dL (ref 50–180)
TIBC: 286 ug/dL (ref 250–425)

## 2024-06-18 ENCOUNTER — Ambulatory Visit: Payer: Self-pay | Admitting: Family Medicine

## 2024-07-06 NOTE — Progress Notes (Unsigned)
 Referring Physician:  Billy Philippe SAUNDERS, NP [address] [place],  [place] [address]  Primary Physician:  Billy Philippe SAUNDERS, NP  History of Present Illness: 07/08/24 Erik Sims is here today with a chief complaint of left sided hemibody numbness after a fall on 04/27/2024.  Per the patient he had been drinking extensively, his partner was also present during his appointment states that he fell off the bed and lost consciousness.  They did not go to the emergency department until the following day, he did have a large right sided head laceration.  At the time he was seen he is mostly complaining of arm pain, no head imaging available to review.  Since the fall, his numbness has not improved.  He feels somewhat weak on the left side.  No incontinence to bowel or bladder.  He is not walking with any assistive device.    Bowel/Bladder Dysfunction: none  Conservative measures:  Physical therapy: Has not participated in. Multimodal medical therapy including regular antiinflammatories: flexeril , oxycodone , robaxin  Injections: no epidural steroid injections.  Past Surgery: no spinal surgeries     The symptoms are causing a significant impact on the patient's life.   Review of Systems:  A 10 point review of systems is negative, except for the pertinent positives and negatives detailed in the HPI.  Past Medical History: Past Medical History:  Diagnosis Date   Back pain     Past Surgical History: No past surgical history on file.  Allergies: Allergies as of 07/08/2024 - Review Complete 07/08/2024  Allergen Reaction Noted   Prednisone Itching 07/11/2014    Medications: No outpatient encounter medications on file as of 07/08/2024.   No facility-administered encounter medications on file as of 07/08/2024.    Social History: Social History   Tobacco Use   Smoking status: Every Day    Current packs/day: 0.50    Types: Cigars, Cigarettes   Smokeless  tobacco: Never  Vaping Use   Vaping status: Never Used  Substance Use Topics   Alcohol use: Not Currently    Alcohol/week: 7.0 standard drinks of alcohol    Types: 7 Cans of beer per week    Comment: Quit in July   Drug use: Yes    Types: Marijuana    Comment: Twice a month    Family Medical History: Family History  Problem Relation Age of Onset   Cancer Mother        Breast   Cancer Father        Prostate   Psychiatric Illness Son    Cancer Maternal Grandmother    Cancer Maternal Grandfather     Physical Examination: @VITALWITHPAIN @  General: Patient is well developed, well nourished, calm, collected, and in no apparent distress. Attention to examination is appropriate.  Psychiatric: Patient is non-anxious.  Head:  Pupils equal, round, and reactive to light.  ENT:  Oral mucosa appears well hydrated.  Neck:   Supple.   Respiratory: Patient is breathing without any difficulty.  Extremities: No edema.  Vascular: Palpable dorsal pedal pulses.  Skin:   On exposed skin, there are no abnormal skin lesions.  NEUROLOGICAL:     Awake, alert, oriented to person, place, and time.  Speech is clear and fluent. Fund of knowledge is appropriate.     Cranial Nerves: Pupils equal round and reactive to light.  Patient appears to have slight eyelid droop on the right.  Healing head abrasion seen on his right forehead above the eyebrow ROM  of spine: minimal tenderness palpation of cervical spine  +Lhermittes    Strength: Side Biceps Triceps Deltoid Interossei Grip Wrist Ext. Wrist Flex.  R 5 5 5 5 5 5 5   L 4+ 4 4+ 4+ 4 4+ 5    3+ bicep reflex on the left, 2-3+ elsewhere on the left upper extremity.  Normoreflexic on the right upper and lower extremity.  Left patella at a 3+.  Otherwise 2+. Hoffmann's on the left. Clonus is not present.  Toes are down-going.  Decree sensation to left upper extremity, torso, lower extremity. Patient with some slight difficulty with  tandem gait, but does not use any assistive device.  Medical Decision Making  Imaging: IMPRESSION: 1. No fracture or static subluxation of the cervical spine. 2. Moderate to severe cervical disc degenerative disease, worst from C5-C7. 3. Probable bony neural foraminal stenosis of the lower cervical levels, assessment limited by incomplete rotation of oblique views provided for review. 4. Cervical disc and neural foraminal pathology may be further evaluated by MRI if indicated by neurologically localizing signs and symptoms  I have personally reviewed the images and agree with the above interpretation.  Assessment and Plan: Mr. Erik Sims is a pleasant 48 y.o. male with a fall with loss of consciousness 2-1/2 months ago with hemibody numbness on the left side since.  Concerned that patient was inebriated, had a fall at height with loss of consciousness, and now has new hemibody numbness.  Concern for brain bleed.  Would like stat CT of his head for evaluation.  If negative, will order MRI of his cervical spine for further evaluation.  Will review results once complete.  Advised the patient not to take any blood thinning medication until results can be reviewed.   Thank you for involving me in the care of this patient.    Lyle Decamp, PA-C Dept. of Neurosurgery

## 2024-07-08 ENCOUNTER — Ambulatory Visit: Payer: Self-pay | Admitting: Physician Assistant

## 2024-07-08 ENCOUNTER — Encounter: Payer: Self-pay | Admitting: Physician Assistant

## 2024-07-08 VITALS — BP 124/74 | Ht 69.0 in | Wt 143.1 lb

## 2024-07-08 DIAGNOSIS — S060X9D Concussion with loss of consciousness of unspecified duration, subsequent encounter: Secondary | ICD-10-CM

## 2024-07-08 DIAGNOSIS — R200 Anesthesia of skin: Secondary | ICD-10-CM

## 2024-07-08 DIAGNOSIS — W19XXXA Unspecified fall, initial encounter: Secondary | ICD-10-CM

## 2024-07-08 DIAGNOSIS — W19XXXD Unspecified fall, subsequent encounter: Secondary | ICD-10-CM

## 2024-07-08 DIAGNOSIS — S060X9A Concussion with loss of consciousness of unspecified duration, initial encounter: Secondary | ICD-10-CM

## 2024-07-10 ENCOUNTER — Other Ambulatory Visit: Payer: Self-pay | Admitting: Physician Assistant

## 2024-07-10 ENCOUNTER — Ambulatory Visit: Payer: Self-pay | Admitting: Physician Assistant

## 2024-07-10 ENCOUNTER — Ambulatory Visit
Admission: RE | Admit: 2024-07-10 | Discharge: 2024-07-10 | Disposition: A | Discharge status: Home / Self Care | Payer: Self-pay | Source: Ambulatory Visit | Attending: Physician Assistant | Admitting: Physician Assistant

## 2024-07-10 DIAGNOSIS — S060X9D Concussion with loss of consciousness of unspecified duration, subsequent encounter: Secondary | ICD-10-CM

## 2024-07-10 DIAGNOSIS — R200 Anesthesia of skin: Secondary | ICD-10-CM

## 2024-07-10 DIAGNOSIS — M542 Cervicalgia: Secondary | ICD-10-CM

## 2024-07-10 DIAGNOSIS — W19XXXD Unspecified fall, subsequent encounter: Secondary | ICD-10-CM

## 2024-07-27 ENCOUNTER — Other Ambulatory Visit: Payer: Self-pay

## 2024-07-28 ENCOUNTER — Ambulatory Visit
Admission: RE | Admit: 2024-07-28 | Discharge: 2024-07-28 | Disposition: A | Discharge status: Home / Self Care | Payer: Self-pay | Source: Ambulatory Visit | Attending: Physician Assistant | Admitting: Physician Assistant

## 2024-07-28 DIAGNOSIS — M542 Cervicalgia: Secondary | ICD-10-CM

## 2024-07-28 DIAGNOSIS — R200 Anesthesia of skin: Secondary | ICD-10-CM

## 2024-08-17 ENCOUNTER — Ambulatory Visit: Admitting: Neurosurgery

## 2024-09-02 ENCOUNTER — Encounter: Payer: Self-pay | Admitting: Internal Medicine

## 2024-09-03 NOTE — Progress Notes (Signed)
 Referring Physician:  Billy Philippe SAUNDERS, NP [address] [place],  [place] [address]  Primary Physician:  Billy Philippe SAUNDERS, NP  Discussed the use of AI scribe software for clinical note transcription with the patient, who gave verbal consent to proceed.  History of Present Illness Erik Sims is a 48 year old male who presents with right arm soreness and hypersensitivity following a fall. He was referred by Mariellen Bottcher for evaluation of neck concerns. He has right arm soreness and hypersensitivity since a fall in September, described as sensitive to touch and itchy. Contact with clothing worsens the discomfort.  He denies numbness, burning, or weakness in the arms, and denies gait difficulty or hand clumsiness. He has numbness on the left chest extending downward since the fall. An October MRI showed a disc herniation with spinal cord contusion. Symptoms are moderate and stable. He has no current neck pain or significant weakness, and right arm strength has improved since his initial evaluation.    07/08/24 Note from Bottcher Decamp, PA-C Erik Sims is here today with a chief complaint of left sided hemibody numbness after a fall on 04/27/2024.  Per the patient he had been drinking extensively, his partner was also present during his appointment states that he fell off the bed and lost consciousness.  They did not go to the emergency department until the following day, he did have a large right sided head laceration.  At the time he was seen he is mostly complaining of arm pain, no head imaging available to review.  Since the fall, his numbness has not improved.  He feels somewhat weak on the left side.  No incontinence to bowel or bladder.  He is not walking with any assistive device.    Bowel/Bladder Dysfunction: none  Conservative measures:  Physical therapy: Has not participated in. Multimodal medical therapy including regular antiinflammatories: flexeril ,  oxycodone , robaxin  Injections: no epidural steroid injections.  Past Surgery: no spinal surgeries     The symptoms are causing a significant impact on the patient's life.   Review of Systems:  A 10 point review of systems is negative, except for the pertinent positives and negatives detailed in the HPI.  Past Medical History: Past Medical History:  Diagnosis Date   Back pain     Past Surgical History: No past surgical history on file.  Allergies: Allergies as of 09/09/2024 - Review Complete 07/08/2024  Allergen Reaction Noted   Prednisone Itching 07/11/2014    Medications: No outpatient encounter medications on file as of 09/09/2024.   No facility-administered encounter medications on file as of 09/09/2024.    Social History: Social History   Tobacco Use   Smoking status: Every Day    Current packs/day: 0.50    Types: Cigars, Cigarettes   Smokeless tobacco: Never  Vaping Use   Vaping status: Never Used  Substance Use Topics   Alcohol use: Not Currently    Alcohol/week: 7.0 standard drinks of alcohol    Types: 7 Cans of beer per week    Comment: Quit in July   Drug use: Yes    Types: Marijuana    Comment: Twice a month    Family Medical History: Family History  Problem Relation Age of Onset   Cancer Mother        Breast   Cancer Father        Prostate   Psychiatric Illness Son    Cancer Maternal Grandmother    Cancer Maternal Grandfather  Physical Examination:   NEUROLOGICAL:     Awake, alert, oriented to person, place, and time.  Speech is clear and fluent. Fund of knowledge is appropriate.     Cranial Nerves: Pupils equal round and reactive to light.  Patient appears to have slight eyelid droop on the right.  Healing head abrasion seen on his right forehead above the eyebrow ROM of spine: minimal tenderness palpation of cervical spine  +Lhermittes    Strength: Side Biceps Triceps Deltoid Interossei Grip Wrist Ext. Wrist Flex.  R  5 5 5 5 5 5 5   L 5 5 4+ 5 5 5 5     3+ bicep reflex on the left, 2-3+ elsewhere on the left upper extremity.   Left patella at a 3+.  Otherwise 2+. Hoffmann's on the left. Clonus is not present.  Toes are down-going.  Decree sensation to left upper extremity and right upper extremity, left torso, lower extremity. Improved ambulation  Medical Decision Making  Imaging: Narrative & Impression  CLINICAL DATA:  Myelopathy, acute, cervical spine Neck pain, chronic, degenerative changes on xray   Neck pain radiating down the left side for 3 months. No known injury. Neuro surgery referral.   EXAM: MRI CERVICAL SPINE WITHOUT CONTRAST   TECHNIQUE: Multiplanar, multisequence MR imaging of the cervical spine was performed. No intravenous contrast was administered.   COMPARISON:  Cervical spine radiographs 06/01/2024 and 02/17/2009.   FINDINGS: Alignment: Physiologic.   Vertebrae: No acute or suspicious osseous findings.   Cord: Cord flattening at C6-7 with possible mild cord hyperintensity.   Posterior Fossa, vertebral arteries, paraspinal tissues: Visualized portions of the posterior fossa appear unremarkable.Bilateral vertebral artery flow voids. No significant paraspinal findings.   Disc levels:   C2-3: Preserved disc height. No spinal stenosis or significant foraminal narrowing.   C3-4: Mild disc bulging with a shallow central disc protrusion and mild uncinate spurring. No significant spinal stenosis or foraminal narrowing.   C4-5: Mild loss of disc height with mild disc bulging, uncinate spurring and facet hypertrophy. No significant spinal stenosis or foraminal narrowing.   C5-6: Mild loss of disc height with mild disc bulging, uncinate spurring and facet hypertrophy. Mild spinal stenosis without cord deformity. Mild foraminal narrowing, greater on the right.   C6-7: Spondylosis with moderate loss of disc height and posterior osteophytes covering diffusely bulging  disc material. Resulting moderate spinal stenosis with narrowing of the AP diameter of the canal to 5 mm. 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 Moderate to severe foraminal narrowing bilaterally with probable encroachment on both C7 nerve roots.   C7-T1: Mild spondylosis with disc bulging and biforaminal disc osteophyte complexes, larger on the right. Mild spinal stenosis without cord deformity. Moderate to severe right and moderate left foraminal narrowing. Probable right C8 nerve root encroachment.   IMPRESSION: 1. 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 Moderate to severe foraminal narrowing bilaterally with probable encroachment on both C7 nerve roots. 2. Mild spinal stenosis and moderate to severe right foraminal narrowing at C7-T1 with probable right C8 nerve root encroachment. 3. Mild spinal stenosis and mild foraminal narrowing at C5-6. 4. No acute osseous findings or significant malalignment. 5. These results will be called to the ordering clinician or representative by the Radiologist Assistant, and communication documented in the PACS or [employer].     Electronically Signed   By: Elsie Perone M.D.   On:  07/30/2024 16:44   I have personally reviewed the images and agree with the above interpretation.  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 Symptoms include right arm soreness, hypersensitivity, and numbness from the left chest down, with hyperreflexia and mild Hoffman's and Tromner's signs. MRI from October shows disc herniation causing spinal cord bruising.  Strength is better than previous evaluation, but reflexes are slightly worse. No significant weakness or gait disturbance. Risks of further injury if another fall occurs. Surgery was recommended and discussed as an option to prevent worsening, but he prefers to avoid surgery. Physical therapy recommended to strengthen muscles, though it does not address the disc herniation. He understands the risks of not going forward with surgery and is accepting of those risks.   - Patient Requests Non operative managment - Ordered physical therapy to strengthen muscles. - Scheduled follow-up appointment in a couple of months to monitor condition. - Advised to contact the clinic if symptoms worsen. - Provided list of physical therapy programs in sign-out paperwork. - Discussed use of over-the-counter compression sleeves for arm discomfort.  Penne MICAEL Sharps, MD Dept. of Neurosurgery

## 2024-09-09 ENCOUNTER — Ambulatory Visit: Payer: Self-pay | Admitting: Neurosurgery

## 2024-09-09 ENCOUNTER — Encounter: Payer: Self-pay | Admitting: Neurosurgery

## 2024-09-09 VITALS — BP 134/70 | Ht 69.0 in | Wt 150.2 lb

## 2024-09-09 DIAGNOSIS — M4802 Spinal stenosis, cervical region: Secondary | ICD-10-CM | POA: Insufficient documentation

## 2024-09-09 DIAGNOSIS — S14129S Central cord syndrome at unspecified level of cervical spinal cord, sequela: Secondary | ICD-10-CM | POA: Insufficient documentation

## 2024-09-09 DIAGNOSIS — S14129D Central cord syndrome at unspecified level of cervical spinal cord, subsequent encounter: Secondary | ICD-10-CM

## 2024-09-09 DIAGNOSIS — W19XXXD Unspecified fall, subsequent encounter: Secondary | ICD-10-CM

## 2024-09-09 DIAGNOSIS — M5020 Other cervical disc displacement, unspecified cervical region: Secondary | ICD-10-CM

## 2024-09-09 NOTE — Patient Instructions (Signed)

## 2024-10-13 ENCOUNTER — Telehealth: Payer: Self-pay | Admitting: Neurosurgery

## 2024-10-13 NOTE — Telephone Encounter (Signed)
 Patient left a voicemail that he is needing a work note to provide to his employer of what he was being treated for by our office. He would like to get this through his MyChart if possible.

## 2024-10-14 NOTE — Telephone Encounter (Addendum)
 I spoke to patient and he will gt his work to fax the FMLA papers to us  or he will bring them to the office. I gave him our fax number.

## 2024-10-14 NOTE — Telephone Encounter (Signed)
 Did Dr. Claudene discuss work with him? Is he working now and if so, what does he do?

## 2024-10-14 NOTE — Telephone Encounter (Addendum)
 It sounds like he needs FMLA to cover him if he needs to miss work intermittently. We can fill this out of him to miss up to 4 days per month as needed. Okay with Dr. Claudene.   I would have him check to see if he has FMLA and get us  the paperwork.

## 2024-10-14 NOTE — Telephone Encounter (Signed)
 Spoke with patient. He works at [employer] in [place]-he is a [employer] [place]. He has been working, he has talked to his employer and has kept them in the know of everything that is going on with him and his symptoms but wanted to have a note from us  to provide to the employer so he does not get points or in trouble if he has to miss work. He had to miss work on 10/12/2024 due to arms aching/hurting really bad, no other recent call outs at this time. Advised we would call him back with our feedback.
